# Patient Record
Sex: Male | Born: 1990 | Race: White | Hispanic: No | Marital: Married | Smoking: Former smoker
Health system: Southern US, Community
[De-identification: ages and names within clinical notes are randomized; demographics above are authoritative.]

## PROBLEM LIST (undated history)

## (undated) DIAGNOSIS — F909 Attention-deficit hyperactivity disorder, unspecified type: Secondary | ICD-10-CM

## (undated) DIAGNOSIS — L409 Psoriasis, unspecified: Secondary | ICD-10-CM

## (undated) HISTORY — DX: Psoriasis, unspecified: L40.9

---
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
## 2015-01-03 ENCOUNTER — Emergency Department (HOSPITAL_COMMUNITY)
Admission: EM | Admit: 2015-01-03 | Discharge: 2015-01-04 | Disposition: A | Discharge status: Home / Self Care | Payer: Commercial Managed Care - PPO | Attending: Emergency Medicine | Admitting: Emergency Medicine

## 2015-01-03 ENCOUNTER — Encounter (HOSPITAL_COMMUNITY): Payer: Self-pay | Admitting: *Deleted

## 2015-01-03 DIAGNOSIS — S299XXA Unspecified injury of thorax, initial encounter: Secondary | ICD-10-CM | POA: Insufficient documentation

## 2015-01-03 DIAGNOSIS — M546 Pain in thoracic spine: Secondary | ICD-10-CM

## 2015-01-03 DIAGNOSIS — S199XXA Unspecified injury of neck, initial encounter: Secondary | ICD-10-CM | POA: Insufficient documentation

## 2015-01-03 DIAGNOSIS — Z720 Tobacco use: Secondary | ICD-10-CM | POA: Insufficient documentation

## 2015-01-03 DIAGNOSIS — Y92410 Unspecified street and highway as the place of occurrence of the external cause: Secondary | ICD-10-CM | POA: Insufficient documentation

## 2015-01-03 DIAGNOSIS — Y9389 Activity, other specified: Secondary | ICD-10-CM | POA: Insufficient documentation

## 2015-01-03 DIAGNOSIS — M25571 Pain in right ankle and joints of right foot: Secondary | ICD-10-CM

## 2015-01-03 DIAGNOSIS — Y998 Other external cause status: Secondary | ICD-10-CM | POA: Insufficient documentation

## 2015-01-03 DIAGNOSIS — S99911A Unspecified injury of right ankle, initial encounter: Secondary | ICD-10-CM | POA: Diagnosis not present

## 2015-01-03 NOTE — ED Notes (Signed)
Pt reports back pain since the 8th of this month.  Pt also reports R ankle pain.  Denies any injury
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
## 2015-01-04 MED ORDER — NAPROXEN 500 MG PO TABS: 500.0000 mg | ORAL_TABLET | Freq: Two times a day (BID) | ORAL | Status: DC

## 2015-01-04 MED ORDER — METHOCARBAMOL 500 MG PO TABS: 500.0000 mg | ORAL_TABLET | Freq: Two times a day (BID) | ORAL | Status: DC

## 2015-01-04 NOTE — Discharge Instructions (Signed)
Muscle Strain °A muscle strain is an injury that occurs when a muscle is stretched beyond its normal length. Usually a small number of muscle fibers are torn when this happens. Muscle strain is rated in degrees. First-degree strains have the least amount of muscle fiber tearing and pain. Second-degree and third-degree strains have increasingly more tearing and pain.  °Usually, recovery from muscle strain takes 1-2 weeks. Complete healing takes 5-6 weeks.  °CAUSES  °Muscle strain happens when a sudden, violent force placed on a muscle stretches it too far. This may occur with lifting, sports, or a fall.  °RISK FACTORS °Muscle strain is especially common in athletes.  °SIGNS AND SYMPTOMS °At the site of the muscle strain, there may be: °· Pain. °· Bruising. °· Swelling. °· Difficulty using the muscle due to pain or lack of normal function. °DIAGNOSIS  °Your health care provider will perform a physical exam and ask about your medical history. °TREATMENT  °Often, the best treatment for a muscle strain is resting, icing, and applying cold compresses to the injured area.   °HOME CARE INSTRUCTIONS  °· Use the PRICE method of treatment to promote muscle healing during the first 2-3 days after your injury. The PRICE method involves: °¨ Protecting the muscle from being injured again. °¨ Restricting your activity and resting the injured body part. °¨ Icing your injury. To do this, put ice in a plastic bag. Place a towel between your skin and the bag. Then, apply the ice and leave it on from 15-20 minutes each hour. After the third day, switch to moist heat packs. °¨ Apply compression to the injured area with a splint or elastic bandage. Be careful not to wrap it too tightly. This may interfere with blood circulation or increase swelling. °¨ Elevate the injured body part above the level of your heart as often as you can. °· Only take over-the-counter or prescription medicines for pain, discomfort, or fever as directed by your  health care provider. °· Warming up prior to exercise helps to prevent future muscle strains. °SEEK MEDICAL CARE IF:  °· You have increasing pain or swelling in the injured area. °· You have numbness, tingling, or a significant loss of strength in the injured area. °MAKE SURE YOU:  °· Understand these instructions. °· Will watch your condition. °· Will get help right away if you are not doing well or get worse. °Document Released: 07/04/2005 Document Revised: 04/24/2013 Document Reviewed: 01/31/2013 °ExitCare® Patient Information ©2015 [employer]. This information is not intended to replace advice given to you by your health care provider. Make sure you discuss any questions you have with your health care provider. °RICE: Routine Care for Injuries °The routine care of many injuries includes Rest, Ice, Compression, and Elevation (RICE). °HOME CARE INSTRUCTIONS °· Rest is needed to allow your body to heal. Routine activities can usually be resumed when comfortable. Injured tendons and bones can take up to 6 weeks to heal. Tendons are the cord-like structures that attach muscle to bone. °· Ice following an injury helps keep the swelling down and reduces pain. °· Put ice in a plastic bag. °· Place a towel between your skin and the bag. °· Leave the ice on for 15-20 minutes, 3-4 times a day, or as directed by your health care provider. Do this while awake, for the first 24 to 48 hours. After that, continue as directed by your caregiver. °· Compression helps keep swelling down. It also gives support and helps with discomfort. If an   elastic bandage has been applied, it should be removed and reapplied every 3 to 4 hours. It should not be applied tightly, but firmly enough to keep swelling down. Watch fingers or toes for swelling, bluish discoloration, coldness, numbness, or excessive pain. If any of these problems occur, remove the bandage and reapply loosely. Contact your caregiver if these problems  continue. °· Elevation helps reduce swelling and decreases pain. With extremities, such as the arms, hands, legs, and feet, the injured area should be placed near or above the level of the heart, if possible. °SEEK IMMEDIATE MEDICAL CARE IF: °· You have persistent pain and swelling. °· You develop redness, numbness, or unexpected weakness. °· Your symptoms are getting worse rather than improving after several days. °These symptoms may indicate that further evaluation or further X-rays are needed. Sometimes, X-rays may not show a small broken bone (fracture) until 1 week or 10 days later. Make a follow-up appointment with your caregiver. Ask when your X-ray results will be ready. Make sure you get your X-ray results. °Document Released: 10/16/2000 Document Revised: 07/09/2013 Document Reviewed: 12/03/2010 °ExitCare® Patient Information ©2015 [employer]. This information is not intended to replace advice given to you by your health care provider. Make sure you discuss any questions you have with your health care provider. ° °

## 2015-02-13 ENCOUNTER — Emergency Department (HOSPITAL_COMMUNITY): Payer: Commercial Managed Care - PPO

## 2015-02-13 ENCOUNTER — Emergency Department (HOSPITAL_COMMUNITY)
Admission: EM | Admit: 2015-02-13 | Discharge: 2015-02-13 | Disposition: A | Discharge status: Home / Self Care | Payer: Commercial Managed Care - PPO | Attending: Emergency Medicine | Admitting: Emergency Medicine

## 2015-02-13 ENCOUNTER — Encounter (HOSPITAL_COMMUNITY): Payer: Self-pay

## 2015-02-13 DIAGNOSIS — Y998 Other external cause status: Secondary | ICD-10-CM | POA: Insufficient documentation

## 2015-02-13 DIAGNOSIS — Y9389 Activity, other specified: Secondary | ICD-10-CM | POA: Diagnosis not present

## 2015-02-13 DIAGNOSIS — W501XXA Accidental kick by another person, initial encounter: Secondary | ICD-10-CM | POA: Insufficient documentation

## 2015-02-13 DIAGNOSIS — S3991XA Unspecified injury of abdomen, initial encounter: Secondary | ICD-10-CM | POA: Diagnosis present

## 2015-02-13 DIAGNOSIS — Y9289 Other specified places as the place of occurrence of the external cause: Secondary | ICD-10-CM | POA: Insufficient documentation

## 2015-02-13 DIAGNOSIS — S301XXA Contusion of abdominal wall, initial encounter: Secondary | ICD-10-CM | POA: Insufficient documentation

## 2015-02-13 DIAGNOSIS — F419 Anxiety disorder, unspecified: Secondary | ICD-10-CM | POA: Insufficient documentation

## 2015-02-13 DIAGNOSIS — Z720 Tobacco use: Secondary | ICD-10-CM | POA: Diagnosis not present

## 2015-02-13 LAB — CBC WITH DIFFERENTIAL/PLATELET
Basophils Absolute: 0 10*3/uL (ref 0.0–0.1)
Basophils Relative: 0 % (ref 0–1)
Eosinophils Absolute: 0.2 10*3/uL (ref 0.0–0.7)
Eosinophils Relative: 2 % (ref 0–5)
HCT: 39.9 % (ref 39.0–52.0)
Hemoglobin: 13.3 g/dL (ref 13.0–17.0)
Lymphocytes Relative: 33 % (ref 12–46)
Lymphs Abs: 3.1 10*3/uL (ref 0.7–4.0)
MCH: 28.1 pg (ref 26.0–34.0)
MCHC: 33.3 g/dL (ref 30.0–36.0)
MCV: 84.2 fL (ref 78.0–100.0)
Monocytes Absolute: 1.3 10*3/uL — ABNORMAL HIGH (ref 0.1–1.0)
Monocytes Relative: 13 % — ABNORMAL HIGH (ref 3–12)
Neutro Abs: 4.8 10*3/uL (ref 1.7–7.7)
Neutrophils Relative %: 52 % (ref 43–77)
Platelets: 272 10*3/uL (ref 150–400)
RBC: 4.74 MIL/uL (ref 4.22–5.81)
RDW: 13.1 % (ref 11.5–15.5)
WBC: 9.3 10*3/uL (ref 4.0–10.5)

## 2015-02-13 LAB — URINALYSIS, ROUTINE W REFLEX MICROSCOPIC
Bilirubin Urine: NEGATIVE
Glucose, UA: NEGATIVE mg/dL
Hgb urine dipstick: NEGATIVE
Ketones, ur: NEGATIVE mg/dL
Leukocytes, UA: NEGATIVE
Nitrite: NEGATIVE
Protein, ur: NEGATIVE mg/dL
Specific Gravity, Urine: 1.034 — ABNORMAL HIGH (ref 1.005–1.030)
Urobilinogen, UA: 0.2 mg/dL (ref 0.0–1.0)
pH: 7 (ref 5.0–8.0)

## 2015-02-13 LAB — COMPREHENSIVE METABOLIC PANEL
ALT: 38 U/L (ref 17–63)
AST: 30 U/L (ref 15–41)
Albumin: 4 g/dL (ref 3.5–5.0)
Alkaline Phosphatase: 41 U/L (ref 38–126)
Anion gap: 6 (ref 5–15)
BUN: 10 mg/dL (ref 6–20)
CO2: 29 mmol/L (ref 22–32)
Calcium: 9 mg/dL (ref 8.9–10.3)
Chloride: 104 mmol/L (ref 101–111)
Creatinine, Ser: 0.82 mg/dL (ref 0.61–1.24)
GFR calc Af Amer: >60 mL/min (ref >60)
GFR calc non Af Amer: >60 mL/min (ref >60)
Glucose, Bld: 110 mg/dL — ABNORMAL HIGH (ref 65–99)
Potassium: 3.3 mmol/L — ABNORMAL LOW (ref 3.5–5.1)
Sodium: 139 mmol/L (ref 135–145)
Total Bilirubin: 0.6 mg/dL (ref 0.3–1.2)
Total Protein: 7.3 g/dL (ref 6.5–8.1)

## 2015-02-13 LAB — SAMPLE TO BLOOD BANK

## 2015-02-13 MED ORDER — CYCLOBENZAPRINE HCL 5 MG PO TABS: 5.0000 mg | ORAL_TABLET | Freq: Three times a day (TID) | ORAL | Status: DC | PRN

## 2015-02-13 MED ORDER — NAPROXEN 500 MG PO TABS: ORAL_TABLET | ORAL | Status: DC

## 2015-02-13 MED ORDER — SODIUM CHLORIDE 0.9 % IV BOLUS (SEPSIS)
1000.0000 mL | Freq: Once | INTRAVENOUS | Status: AC
  Administered 2015-02-13: 1000 mL via INTRAVENOUS

## 2015-02-13 MED ORDER — SODIUM CHLORIDE 0.9 % IV SOLN
INTRAVENOUS | Status: DC
  Administered 2015-02-13: 04:00:00 via INTRAVENOUS

## 2015-02-13 MED ORDER — FENTANYL CITRATE (PF) 100 MCG/2ML IJ SOLN: 50.0000 ug | Freq: Once | INTRAMUSCULAR | Status: DC

## 2015-02-13 MED ORDER — TRAMADOL HCL 50 MG PO TABS: ORAL_TABLET | ORAL | Status: DC

## 2015-02-13 MED ORDER — IOHEXOL 300 MG/ML  SOLN
100.0000 mL | Freq: Once | INTRAMUSCULAR | Status: AC | PRN
  Administered 2015-02-13: 100 mL via INTRAVENOUS

## 2015-02-13 MED ORDER — ONDANSETRON HCL 4 MG/2ML IJ SOLN
4.0000 mg | Freq: Once | INTRAMUSCULAR | Status: AC
  Administered 2015-02-13: 4 mg via INTRAVENOUS
  Filled 2015-02-13: qty 2

## 2015-02-13 MED ORDER — FENTANYL CITRATE (PF) 100 MCG/2ML IJ SOLN
25.0000 ug | Freq: Once | INTRAMUSCULAR | Status: AC
  Administered 2015-02-13: 25 ug via INTRAVENOUS
  Filled 2015-02-13: qty 2

## 2015-02-13 NOTE — ED Notes (Signed)
MD I. Knapp at bedside. 

## 2015-02-13 NOTE — ED Notes (Signed)
Pt aware of the need for a urine sample. 
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
## 2015-02-13 NOTE — ED Notes (Addendum)
Pt alert,oriented, and ambulatory upon DC. He was advised to follow up with PCP and come back if he worsens and to take medications as prescribed to help relieve pain. He verabalizes understanding. He leaves with two male friends.
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
## 2015-02-13 NOTE — ED Notes (Signed)
MD Knapp at bedside 

## 2015-02-13 NOTE — Discharge Instructions (Signed)
Ice packs to the bruised area for pain and use heat to help the blood reabsorb. Take the medication as prescribed for pain.  Return to the ED if you get a fever, worsening pain, vomiting, blood in your BM's or urine.    Contusion A contusion is a deep bruise. Contusions are the result of an injury that caused bleeding under the skin. The contusion may turn blue, purple, or yellow. Minor injuries will give you a painless contusion, but more severe contusions may stay painful and swollen for a few weeks.  CAUSES  A contusion is usually caused by a blow, trauma, or direct force to an area of the body. SYMPTOMS   Swelling and redness of the injured area.  Bruising of the injured area.  Tenderness and soreness of the injured area.  Pain. DIAGNOSIS  The diagnosis can be made by taking a history and physical exam. An X-ray, CT scan, or MRI may be needed to determine if there were any associated injuries, such as fractures. TREATMENT  Specific treatment will depend on what area of the body was injured. In general, the best treatment for a contusion is resting, icing, elevating, and applying cold compresses to the injured area. Over-the-counter medicines may also be recommended for pain control. Ask your caregiver what the best treatment is for your contusion. HOME CARE INSTRUCTIONS   Put ice on the injured area.  Put ice in a plastic bag.  Place a towel between your skin and the bag.  Leave the ice on for 15-20 minutes, 3-4 times a day, or as directed by your health care provider.  Only take over-the-counter or prescription medicines for pain, discomfort, or fever as directed by your caregiver. Your caregiver may recommend avoiding anti-inflammatory medicines (aspirin, ibuprofen, and naproxen) for 48 hours because these medicines may increase bruising.  Rest the injured area.  If possible, elevate the injured area to reduce swelling. SEEK IMMEDIATE MEDICAL CARE IF:   You have increased  bruising or swelling.  You have pain that is getting worse.  Your swelling or pain is not relieved with medicines. MAKE SURE YOU:   Understand these instructions.  Will watch your condition.  Will get help right away if you are not doing well or get worse. Document Released: 04/13/2005 Document Revised: 07/09/2013 Document Reviewed: 05/09/2011 [facility] Patient Information 2015 [place], [place]. This information is not intended to replace advice given to you by your health care provider. Make sure you discuss any questions you have with your health care provider.

## 2015-09-20 ENCOUNTER — Emergency Department (INDEPENDENT_AMBULATORY_CARE_PROVIDER_SITE_OTHER): Payer: Self-pay

## 2015-09-20 ENCOUNTER — Emergency Department (INDEPENDENT_AMBULATORY_CARE_PROVIDER_SITE_OTHER)
Admission: EM | Admit: 2015-09-20 | Discharge: 2015-09-20 | Disposition: A | Discharge status: Home / Self Care | Payer: Self-pay | Source: Home / Self Care | Attending: Family Medicine | Admitting: Family Medicine

## 2015-09-20 ENCOUNTER — Encounter (HOSPITAL_COMMUNITY): Payer: Self-pay | Admitting: Emergency Medicine

## 2015-09-20 DIAGNOSIS — M545 Low back pain, unspecified: Secondary | ICD-10-CM

## 2015-09-20 MED ORDER — CYCLOBENZAPRINE HCL 10 MG PO TABS: 10.0000 mg | ORAL_TABLET | Freq: Two times a day (BID) | ORAL | Status: DC | PRN

## 2015-09-20 MED ORDER — NAPROXEN SODIUM 550 MG PO TABS: 550.0000 mg | ORAL_TABLET | Freq: Two times a day (BID) | ORAL | Status: DC

## 2015-09-20 NOTE — Discharge Instructions (Signed)
Back Pain, Adult Back pain is very common in adults.The cause of back pain is rarely dangerous and the pain often gets better over time.The cause of your back pain may not be known. Some common causes of back pain include:  Strain of the muscles or ligaments supporting the spine.  Wear and tear (degeneration) of the spinal disks.  Arthritis.  Direct injury to the back. For many people, back pain may return. Since back pain is rarely dangerous, most people can learn to manage this condition on their own. HOME CARE INSTRUCTIONS Watch your back pain for any changes. The following actions may help to lessen any discomfort you are feeling:  Remain active. It is stressful on your back to sit or stand in one place for long periods of time. Do not sit, drive, or stand in one place for more than 30 minutes at a time. Take short walks on even surfaces as soon as you are able.Try to increase the length of time you walk each day.  Exercise regularly as directed by your health care provider. Exercise helps your back heal faster. It also helps avoid future injury by keeping your muscles strong and flexible.  Do not stay in bed.Resting more than 1-2 days can delay your recovery.  Pay attention to your body when you bend and lift. The most comfortable positions are those that put less stress on your recovering back. Always use proper lifting techniques, including:  Bending your knees.  Keeping the load close to your body.  Avoiding twisting.  Find a comfortable position to sleep. Use a firm mattress and lie on your side with your knees slightly bent. If you lie on your back, put a pillow under your knees.  Avoid feeling anxious or stressed.Stress increases muscle tension and can worsen back pain.It is important to recognize when you are anxious or stressed and learn ways to manage it, such as with exercise.  Take medicines only as directed by your health care provider. Over-the-counter  medicines to reduce pain and inflammation are often the most helpful.Your health care provider may prescribe muscle relaxant drugs.These medicines help dull your pain so you can more quickly return to your normal activities and healthy exercise.  Apply ice to the injured area:  Put ice in a plastic bag.  Place a towel between your skin and the bag.  Leave the ice on for 20 minutes, 2-3 times a day for the first 2-3 days. After that, ice and heat may be alternated to reduce pain and spasms.  Maintain a healthy weight. Excess weight puts extra stress on your back and makes it difficult to maintain good posture. SEEK MEDICAL CARE IF:  You have pain that is not relieved with rest or medicine.  You have increasing pain going down into the legs or buttocks.  You have pain that does not improve in one week.  You have night pain.  You lose weight.  You have a fever or chills. SEEK IMMEDIATE MEDICAL CARE IF:   You develop new bowel or bladder control problems.  You have unusual weakness or numbness in your arms or legs.  You develop nausea or vomiting.  You develop abdominal pain.  You feel faint.   This information is not intended to replace advice given to you by your health care provider. Make sure you discuss any questions you have with your health care provider.   Document Released: 07/04/2005 Document Revised: 07/25/2014 Document Reviewed: 11/05/2013 Elsevier Interactive Patient Education 2016 [employer] ° °Back Injury Prevention °Back injuries can be very painful. They can also be difficult to heal. After having one back injury, you are more likely to injure your back again. It is important to learn how to avoid injuring or re-injuring your back. The following tips can help you to prevent a back injury. °WHAT SHOULD I KNOW ABOUT PHYSICAL FITNESS? °· Exercise for 30 minutes per day on most days of the week or as directed by your health care provider. Make sure to: °¨ Do  aerobic exercises, such as walking, jogging, biking, or swimming. °¨ Do exercises that increase balance and strength, such as tai chi and yoga. These can decrease your risk of falling and injuring your back. °¨ Do stretching exercises to help with flexibility. °¨ Try to develop strong abdominal muscles. Your abdominal muscles provide a lot of the support that is needed by your back. °· Maintain a healthy weight.  This helps to decrease your risk of a back injury. °WHAT SHOULD I KNOW ABOUT MY DIET? °· Talk with your health care provider about your overall diet. Take supplements and vitamins only as directed by your health care provider. °· Talk with your health care provider about how much calcium and vitamin D you need each day. These nutrients help to prevent weakening of the bones (osteoporosis). Osteoporosis can cause broken (fractured) bones, which lead to back pain. °· Include good sources of calcium in your diet, such as dairy products, green leafy vegetables, and products that have had calcium added to them (fortified). °· Include good sources of vitamin D in your diet, such as milk and foods that are fortified with vitamin D. °WHAT SHOULD I KNOW ABOUT MY POSTURE? °· Sit up straight and stand up straight. Avoid leaning forward when you sit or hunching over when you stand. °· Choose chairs that have good low-back (lumbar) support. °· If you work at a desk, sit close to it so you do not need to lean over. Keep your chin tucked in. Keep your neck drawn back, and keep your elbows bent at a right angle. Your arms should look like the letter "L." °· Sit high and close to the steering wheel when you drive. Add a lumbar support to your car seat, if needed. °· Avoid sitting or standing in one position for very long. Take breaks to get up, stretch, and walk around at least one time every hour. Take breaks every hour if you are driving for long periods of time. °· Sleep on your side with your knees slightly bent, or  sleep on your back with a pillow under your knees. Do not lie on the front of your body to sleep. °WHAT SHOULD I KNOW ABOUT LIFTING, TWISTING, AND REACHING? °Lifting and Heavy Lifting °· Avoid heavy lifting, especially repetitive heavy lifting. If you must do heavy lifting: °¨ Stretch before lifting. °¨ Work slowly. °¨ Rest between lifts. °¨ Use a tool such as a cart or a dolly to move objects if one is available. °¨ Make several small trips instead of carrying one heavy load. °¨ Ask for help when you need it, especially when moving big objects. °· Follow these steps when lifting: °¨ Stand with your feet shoulder-width apart. °¨ Get as close to the object as you can. Do not try to pick up a heavy object that is far from your body. °¨ Use handles or lifting straps if they are available. °¨ Bend at your knees. Squat down, but keep your heels   off the floor. °¨ Keep your shoulders pulled back, your chin tucked in, and your back straight. °¨ Lift the object slowly while you tighten the muscles in your legs, abdomen, and buttocks. Keep the object as close to the center of your body as possible. °· Follow these steps when putting down a heavy load: °¨ Stand with your feet shoulder-width apart. °¨ Lower the object slowly while you tighten the muscles in your legs, abdomen, and buttocks. Keep the object as close to the center of your body as possible. °¨ Keep your shoulders pulled back, your chin tucked in, and your back straight. °¨ Bend at your knees. Squat down, but keep your heels off the floor. °¨ Use handles or lifting straps if they are available. °Twisting and Reaching °· Avoid lifting heavy objects above your waist. °· Do not twist at your waist while you are lifting or carrying a load. If you need to turn, move your feet. °· Do not bend over without bending at your knees. °· Avoid reaching over your head, across a table, or for an object on a high surface. °WHAT ARE SOME OTHER TIPS? °· Avoid wet floors and icy  ground. Keep sidewalks clear of ice to prevent falls. °· Do not sleep on a mattress that is too soft or too hard. °· Keep items that are used frequently within easy reach. °· Put heavier objects on shelves at waist level, and put lighter objects on lower or higher shelves. °· Find ways to decrease your stress, such as exercise, massage, or relaxation techniques. Stress can build up in your muscles. Tense muscles are more vulnerable to injury. °· Talk with your health care provider if you feel anxious or depressed. These conditions can make back pain worse. °· Wear flat heel shoes with cushioned soles. °· Avoid sudden movements. °· Use both shoulder straps when carrying a backpack. °· Do not use any tobacco products, including cigarettes, chewing tobacco, or electronic cigarettes. If you need help quitting, ask your health care provider. °  °This information is not intended to replace advice given to you by your health care provider. Make sure you discuss any questions you have with your health care provider. °  °Document Released: 08/11/2004 Document Revised: 11/18/2014 Document Reviewed: 07/08/2014 °Elsevier Interactive Patient Education ©2016 Elsevier Inc. ° °
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
## 2016-06-03 ENCOUNTER — Ambulatory Visit (HOSPITAL_COMMUNITY)
Admission: EM | Admit: 2016-06-03 | Discharge: 2016-06-03 | Disposition: A | Discharge status: Home / Self Care | Payer: No Typology Code available for payment source | Attending: Family Medicine | Admitting: Family Medicine

## 2016-06-03 ENCOUNTER — Ambulatory Visit (INDEPENDENT_AMBULATORY_CARE_PROVIDER_SITE_OTHER): Payer: No Typology Code available for payment source

## 2016-06-03 ENCOUNTER — Encounter (HOSPITAL_COMMUNITY): Payer: Self-pay | Admitting: Family Medicine

## 2016-06-03 DIAGNOSIS — Y92099 Unspecified place in other non-institutional residence as the place of occurrence of the external cause: Secondary | ICD-10-CM | POA: Diagnosis not present

## 2016-06-03 DIAGNOSIS — Y92009 Unspecified place in unspecified non-institutional (private) residence as the place of occurrence of the external cause: Principal | ICD-10-CM

## 2016-06-03 DIAGNOSIS — S8011XA Contusion of right lower leg, initial encounter: Secondary | ICD-10-CM

## 2016-06-03 DIAGNOSIS — W19XXXA Unspecified fall, initial encounter: Secondary | ICD-10-CM

## 2016-06-03 MED ORDER — TRAMADOL HCL 50 MG PO TABS: 50.0000 mg | ORAL_TABLET | Freq: Four times a day (QID) | ORAL | 0 refills | Status: DC | PRN

## 2016-06-03 NOTE — ED Provider Notes (Signed)
[facility]    CSN: [id number] Arrival date & time: 06/03/16  1829     History   Chief Complaint Chief Complaint  Patient presents with  . Fall    HPI Jonathan Valdez is a 25 y.o. male.   The history is provided by the patient.  Fall  This is a new problem. The current episode started yesterday. The problem has not changed since onset.Pertinent negatives include no headaches. Associated symptoms comments: Right elbow, knee and ankle injury from getting knocked down while coming down stairs at home.. The symptoms are aggravated by bending.    History reviewed. No pertinent past medical history.  There are no active problems to display for this patient.   History reviewed. No pertinent surgical history.     Home Medications    Prior to Admission medications   Not on File    Family History History reviewed. No pertinent family history.  Social History Social History  Substance Use Topics  . Smoking status: Current Every Day Smoker  . Smokeless tobacco: Never Used  . Alcohol use No     Allergies   Patient has no known allergies.   Review of Systems Review of Systems  Constitutional: Negative.   Genitourinary: Negative.   Musculoskeletal: Positive for gait problem and joint swelling.  Skin: Negative.   Neurological: Negative for seizures, speech difficulty, numbness and headaches.  All other systems reviewed and are negative.    Physical Exam Triage Vital Signs ED Triage Vitals  Enc Vitals Group     BP 06/03/16 1849 122/64     Pulse Rate 06/03/16 1849 74     Resp 06/03/16 1849 18     Temp 06/03/16 1849 97.9 F (36.6 C)     Temp Source 06/03/16 1849 Oral     SpO2 06/03/16 1849 98 %     Weight --      Height --      Head Circumference --      Peak Flow --      Pain Score 06/03/16 1850 7     Pain Loc --      Pain Edu? --      Excl. in GC? --    No data found.   Updated Vital Signs BP 122/64   Pulse 74   Temp 97.9 F (36.6  C) (Oral)   Resp 18   SpO2 98%   Visual Acuity Right Eye Distance:   Left Eye Distance:   Bilateral Distance:    Right Eye Near:   Left Eye Near:    Bilateral Near:     Physical Exam  Constitutional: He is oriented to person, place, and time. He appears well-developed and well-nourished. No distress.  Musculoskeletal: He exhibits tenderness. He exhibits no deformity.       Right elbow: He exhibits swelling. He exhibits normal range of motion, no effusion and no deformity. Tenderness found. Olecranon process tenderness noted.       Right knee: He exhibits ecchymosis, erythema and bony tenderness. He exhibits normal range of motion, no swelling, no effusion, normal alignment, no LCL laxity, normal patellar mobility, normal meniscus and no MCL laxity. Tenderness found. Lateral joint line tenderness noted. No MCL, no LCL and no patellar tendon tenderness noted.       Right ankle: He exhibits swelling. He exhibits normal pulse. Tenderness. Lateral malleolus tenderness found. Achilles tendon normal. Achilles tendon exhibits no pain, no defect and normal Thompson's test results.  Neurological: He is  alert and oriented to person, place, and time.  Skin: Skin is warm.  Nursing note and vitals reviewed.    UC Treatments / Results  Labs (all labs ordered are listed, but only abnormal results are displayed) Labs Reviewed - No data to display  EKG  EKG Interpretation None       Radiology No results found. X-rays reviewed and report per radiologist.  Procedures Procedures (including critical care time)  Medications Ordered in UC Medications - No data to display   Initial Impression / Assessment and Plan / UC Course  I have reviewed the triage vital signs and the nursing notes.  Pertinent labs & imaging results that were available during my care of the patient were reviewed by me and considered in my medical decision making (see chart for details).  Clinical Course        Final Clinical Impressions(s) / UC Diagnoses   Final diagnoses:  None    New Prescriptions New Prescriptions   No medications on file     Linna HoffJames D Jacquan Savas, MD 06/03/16 1930

## 2016-06-03 NOTE — ED Triage Notes (Signed)
Pt here for fall yesterday. sts he was tripped by his neighbors dog and fell injuring right elbow, knee, and ankle.

## 2016-06-03 NOTE — Discharge Instructions (Signed)
Ice advil or pain medicine as needed, out of work until mon.11/20.

## 2016-10-04 ENCOUNTER — Ambulatory Visit (HOSPITAL_COMMUNITY)
Admission: EM | Admit: 2016-10-04 | Discharge: 2016-10-04 | Disposition: A | Discharge status: Home / Self Care | Payer: Managed Care, Other (non HMO) | Attending: Family Medicine | Admitting: Family Medicine

## 2016-10-04 ENCOUNTER — Encounter (HOSPITAL_COMMUNITY): Payer: Self-pay | Admitting: Family Medicine

## 2016-10-04 DIAGNOSIS — S63502A Unspecified sprain of left wrist, initial encounter: Secondary | ICD-10-CM

## 2016-10-04 DIAGNOSIS — M654 Radial styloid tenosynovitis [de Quervain]: Secondary | ICD-10-CM | POA: Diagnosis not present

## 2016-10-04 MED ORDER — IBUPROFEN 800 MG PO TABS: 800.0000 mg | ORAL_TABLET | Freq: Three times a day (TID) | ORAL | 0 refills | Status: DC

## 2016-10-04 NOTE — ED Provider Notes (Signed)
CSN: [id number]     Arrival date & time 10/04/16  1850 History   None    Chief Complaint  Patient presents with  . Wrist Pain   (Consider location/radiation/quality/duration/timing/severity/associated sxs/prior Treatment) Patient c/o left wrist pain.  Patient has pain in wrist radiating to left thumb.   The history is provided by the patient.  Wrist Pain  This is a new problem. The current episode started less than 1 hour ago. The problem occurs constantly. The problem has not changed since onset.Nothing aggravates the symptoms. Nothing relieves the symptoms.    History reviewed. No pertinent past medical history. History reviewed. No pertinent surgical history. History reviewed. No pertinent family history. Social History  Substance Use Topics  . Smoking status: Current Every Day Smoker  . Smokeless tobacco: Never Used  . Alcohol use No    Review of Systems  Constitutional: Negative.   HENT: Negative.   Eyes: Negative.   Respiratory: Negative.   Cardiovascular: Negative.   Gastrointestinal: Negative.   Endocrine: Negative.   Genitourinary: Negative.   Musculoskeletal: Positive for arthralgias.  Allergic/Immunologic: Negative.   Neurological: Negative.     Allergies  Patient has no known allergies.  Home Medications   Prior to Admission medications   Medication Sig Start Date End Date Taking? Authorizing Provider  ibuprofen (ADVIL,MOTRIN) 800 MG tablet Take 1 tablet (800 mg total) by mouth 3 (three) times daily. 10/04/16   Deatra CanterWilliam J Tyvion Edmondson, FNP   Meds Ordered and Administered this Visit  Medications - No data to display  BP 129/86   Pulse 64   Temp 98.4 F (36.9 C)   Resp 16   SpO2 98%  No data found.   Physical Exam  Constitutional: He is oriented to person, place, and time. He appears well-developed and well-nourished.  HENT:  Head: Normocephalic and atraumatic.  Eyes: Conjunctivae and EOM are normal. Pupils are equal, round, and reactive to light.   Neck: Normal range of motion. Neck supple.  Cardiovascular: Normal rate, regular rhythm and normal heart sounds.   Pulmonary/Chest: Effort normal and breath sounds normal.  Musculoskeletal: He exhibits tenderness.  Positive left finklestein   Neurological: He is alert and oriented to person, place, and time.  Nursing note and vitals reviewed.   Urgent Care Course     Procedures (including critical care time)  Labs Review Labs Reviewed - No data to display  Imaging Review No results found.   Visual Acuity Review  Right Eye Distance:   Left Eye Distance:   Bilateral Distance:    Right Eye Near:   Left Eye Near:    Bilateral Near:         MDM   1. Tenosynovitis, de Quervain   2. Sprain of left wrist, initial encounter    Motrin 800mg  one po tid x 10 days #30 Left Spica Splint      Deatra CanterWilliam J Katee Wentland, FNP 10/04/16 1932    Deatra CanterWilliam J Ethelle Ola, FNP 10/04/16 1944

## 2016-10-04 NOTE — ED Triage Notes (Signed)
pt here for left wrist pain. sts that he has been hearing loud popping sounds and has limited ROM. sts unsure if injury but started about a month ago and worse.

## 2017-02-15 ENCOUNTER — Encounter: Payer: Self-pay | Admitting: Emergency Medicine

## 2017-02-15 ENCOUNTER — Emergency Department
Admission: EM | Admit: 2017-02-15 | Discharge: 2017-02-15 | Disposition: A | Discharge status: Home / Self Care | Payer: Managed Care, Other (non HMO) | Attending: Emergency Medicine | Admitting: Emergency Medicine

## 2017-02-15 DIAGNOSIS — F17210 Nicotine dependence, cigarettes, uncomplicated: Secondary | ICD-10-CM | POA: Insufficient documentation

## 2017-02-15 DIAGNOSIS — M2550 Pain in unspecified joint: Secondary | ICD-10-CM | POA: Diagnosis not present

## 2017-02-15 LAB — GLUCOSE, CAPILLARY: Glucose-Capillary: 113 mg/dL — ABNORMAL HIGH (ref 65–99)

## 2017-02-15 MED ORDER — MELOXICAM 15 MG PO TABS: 15.0000 mg | ORAL_TABLET | Freq: Every day | ORAL | 0 refills | Status: DC

## 2017-02-15 NOTE — Discharge Instructions (Signed)
Please follow up with Primary Care when possible. Return to the ER for symptoms that change or worsen if unable to schedule an appointment.

## 2017-02-15 NOTE — ED Provider Notes (Signed)
[facility] Emergency Department Provider Note ____________________________________________  Time seen: Approximately 6:27 PM  I have reviewed the triage vital signs and the nursing notes.   HISTORY  Chief Complaint Joint Pain    HPI Jonathan Valdez is a 26 y.o. male who presents to the emergency department for evaluation of joint pain that has been present for the past year or so.Pain is mainly in both wrists and causes him to lose grip strength after he spent at work several hours. He states that he also has pain in the joints of his knees, ankles, and his back but denies any radicular symptoms in those areas. Upon awakening, he states that he is barely able to get out of bed but after some time and moving around the stiffness and pain improves. He reports a family history of rheumatoid arthritis. He also reports a family history of diabetes. He has not been tested for any illness or had a physical since he was a child. He has taken ibuprofen intermittently without any relief. He denies recent injury. He does a lot of repetitive motion and lifting while at work and feels that this has contributed to his symptoms. He states that his work required that he come for an evaluation and a work excuse if he needed to miss any additional days of work. He states that he did not work yesterday due to the pain in the wrists.  History reviewed. No pertinent past medical history.  There are no active problems to display for this patient.   No past surgical history on file.  Prior to Admission medications   Medication Sig Start Date End Date Taking? Authorizing Provider  meloxicam (MOBIC) 15 MG tablet Take 1 tablet (15 mg total) by mouth daily. 02/15/17   Chinita Pesterriplett, Charity Tessier B, FNP    Allergies Patient has no known allergies.  No family history on file.  Social History Social History  Substance Use Topics  . Smoking status: Current Every Day Smoker  . Smokeless tobacco: Never  Used  . Alcohol use No    Review of Systems Constitutional: Negative for recent illness or injury. Cardiovascular: Negative for change in skin temperature or color. Respiratory: Negative for cough or shortness of breath. Musculoskeletal: Positive for polyarthralgias. Skin: Negative for rash, lesion, or wound.  Neurological: Positive for radicular symptoms in the wrists  ____________________________________________   PHYSICAL EXAM:  VITAL SIGNS: ED Triage Vitals [02/15/17 1807]  Enc Vitals Group     BP (!) 143/69     Pulse Rate 98     Resp 18     Temp 98.2 F (36.8 C)     Temp Source Oral     SpO2 96 %     Weight 280 lb (127 kg)     Height 6\' 6"  (1.981 m)     Head Circumference      Peak Flow      Pain Score 5     Pain Loc      Pain Edu?      Excl. in GC?     Constitutional: Alert and oriented. Well appearing and in no acute distress. Eyes: Conjunctivae are clear without discharge or drainage.  Head: Atraumatic Neck: Full, active range of motion is observed. Respiratory: Respirations are even and unlabored Musculoskeletal: Full, active range of motion throughout. Neurologic: Positive Tinel's and Phalen bilaterally  Skin: Atraumatic without rash, lesion, or wound. Psychiatric: Behavior and affect are appropriate.  ____________________________________________   LABS (all labs ordered are  listed, but only abnormal results are displayed)  Labs Reviewed  GLUCOSE, CAPILLARY - Abnormal; Notable for the following:       Result Value   Glucose-Capillary 113 (*)    All other components within normal limits  CBG MONITORING, ED   ____________________________________________  RADIOLOGY  Not indicated ____________________________________________   PROCEDURES  Procedure(s) performed: None  ____________________________________________   INITIAL IMPRESSION / ASSESSMENT AND PLAN / ED COURSE  Jonathan Valdez is a 26 y.o. male who presents to the emergency  department for evaluation of widespread joint pain that has progressively worsened over the past year. No sudden changes that prompted his visit to the emergency department other than having had to call out of work last night. He has taken ibuprofen intermittently without any relief. He was concerned that he may be diabetic and CBG was 113 which provided him reassurance. He will be given a prescription for meloxicam and encouraged to establish a primary care provider that can do a full physical including specific labs to diagnose or rule out rheumatoid arthritis. He was advised to return to the emergency department for symptoms that change or worsen or for new concerns if he is unable schedule an appointment.  Pertinent labs & imaging results that were available during my care of the patient were reviewed by me and considered in my medical decision making (see chart for details).  _________________________________________   FINAL CLINICAL IMPRESSION(S) / ED DIAGNOSES  Final diagnoses:  Polyarthralgia    Discharge Medication List as of 02/15/2017  7:20 PM    START taking these medications   Details  meloxicam (MOBIC) 15 MG tablet Take 1 tablet (15 mg total) by mouth daily., Starting Wed 02/15/2017, Print        If controlled substance prescribed during this visit, 12 month history viewed on the [facility] prior to issuing an initial prescription for Schedule II or III opiod.    Chinita Pesterriplett, Genecis Veley B, FNP 02/15/17 2145    Phineas SemenGoodman, [place], MD 02/15/17 2242

## 2017-02-15 NOTE — ED Triage Notes (Signed)
Pt reports ankle, knee and elbow aches for over one year. Pt reports wt loss has helped some. Pt also reports pins and needles feeling in wrists for 6 months. Pt reports does a lot of repetitive motions at work.

## 2017-02-15 NOTE — ED Notes (Signed)
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

## 2017-04-29 ENCOUNTER — Ambulatory Visit (HOSPITAL_COMMUNITY)
Admission: EM | Admit: 2017-04-29 | Discharge: 2017-04-29 | Disposition: A | Discharge status: Home / Self Care | Payer: Managed Care, Other (non HMO) | Attending: Internal Medicine | Admitting: Internal Medicine

## 2017-04-29 ENCOUNTER — Encounter (HOSPITAL_COMMUNITY): Payer: Self-pay | Admitting: Emergency Medicine

## 2017-04-29 DIAGNOSIS — R079 Chest pain, unspecified: Secondary | ICD-10-CM

## 2017-04-29 DIAGNOSIS — R0789 Other chest pain: Secondary | ICD-10-CM

## 2017-04-29 DIAGNOSIS — R0981 Nasal congestion: Secondary | ICD-10-CM | POA: Diagnosis not present

## 2017-04-29 DIAGNOSIS — J209 Acute bronchitis, unspecified: Secondary | ICD-10-CM | POA: Diagnosis not present

## 2017-04-29 MED ORDER — OMEPRAZOLE 40 MG PO CPDR: 40.0000 mg | DELAYED_RELEASE_CAPSULE | Freq: Two times a day (BID) | ORAL | 1 refills | Status: DC

## 2017-04-29 MED ORDER — TRIAMCINOLONE ACETONIDE 55 MCG/ACT NA AERO: 2.0000 | INHALATION_SPRAY | Freq: Every day | NASAL | 0 refills | Status: DC

## 2017-04-29 MED ORDER — ALBUTEROL SULFATE HFA 108 (90 BASE) MCG/ACT IN AERS: 1.0000 | INHALATION_SPRAY | RESPIRATORY_TRACT | 0 refills | Status: DC | PRN

## 2017-04-29 MED ORDER — IPRATROPIUM-ALBUTEROL 0.5-2.5 (3) MG/3ML IN SOLN
3.0000 mL | Freq: Once | RESPIRATORY_TRACT | Status: AC
  Administered 2017-04-29: 3 mL via RESPIRATORY_TRACT

## 2017-04-29 MED ORDER — IPRATROPIUM-ALBUTEROL 0.5-2.5 (3) MG/3ML IN SOLN
RESPIRATORY_TRACT | Status: AC
  Filled 2017-04-29: qty 3

## 2017-04-29 NOTE — ED Triage Notes (Signed)
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 NAD.Marland Kitchen. ambulatory

## 2017-04-29 NOTE — Discharge Instructions (Addendum)
No danger signs on exam today. EKG did not show a heart attack today. Go to the emergency room if severe/sustained chest pain occurs. Breathing treatment was given in the urgent care today. Prescription for an albuterol inhaler and a nasal steroid inhaler were sent to the pharmacy, to help with symptoms of respiratory infection. Prescription for omeprazole, to help with stomach acid and hopefully with chest discomfort, was sent to the pharmacy. Continue efforts to establish with a primary care provider, to continue evaluation of symptoms. Note for work today and tomorrow.  Recheck for new fever >100.5, increasing phlegm production/nasal discharge, or if not starting to improve in a few days.

## 2017-04-29 NOTE — ED Provider Notes (Signed)
[facility]    CSN: [id number] Arrival date & time: 04/29/17  1504     History   Chief Complaint Chief Complaint  Patient presents with  . Chest Pain    HPI Jonathan Valdez is a 26 y.o. male. He presents today with chest pain that started this morning. He is under a lot of stress, a lot of it is work stress, and he is looking for another job. He has chest pain on and off, and has other symptoms that he feels are stress related.  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 Additional symptoms include a feeling of being off balance, some nausea, some diarrhea. Tingling in his hands. He does take in some energy drinks, denies the use of cocaine. Family history, does not know his father. Mother has some kind of arthritis, no heart disease.    HPI  History reviewed. No pertinent past medical history.   History reviewed. No pertinent surgical history.     Home Medications    Prior to Admission medications   Medication Sig Start Date End Date Taking? Authorizing Provider  albuterol (PROVENTIL HFA;VENTOLIN HFA) 108 (90 Base) MCG/ACT inhaler Inhale 1-2 puffs into the lungs every 4 (four) hours as needed for wheezing or shortness of breath. 04/29/17   Eustace Moore, MD  meloxicam (MOBIC) 15 MG tablet Take 1 tablet (15 mg total) by mouth daily. 02/15/17   Chinita Pester, FNP  omeprazole (PRILOSEC) 40 MG capsule Take 1 capsule (40 mg total) by mouth 2 (two) times daily before a meal. 04/29/17 05/29/17  Eustace Moore, MD  triamcinolone (NASACORT) 55 MCG/ACT AERO nasal inhaler Place 2 sprays into the nose daily. 04/29/17   Eustace Moore, MD    Family History History reviewed. No pertinent family history.  Social History Social History  Substance Use Topics  . Smoking status: Current Every Day Smoker  . Smokeless tobacco: Never Used  . Alcohol use No      Allergies   Patient has no known allergies.   Review of Systems Review of Systems  All other systems reviewed and are negative.    Physical Exam Triage Vital Signs ED Triage Vitals  Enc Vitals Group     BP 04/29/17 1532 (!) 119/47     Pulse Rate 04/29/17 1532 77     Resp 04/29/17 1532 20     Temp 04/29/17 1532 98.3 F (36.8 C)     Temp Source 04/29/17 1532 Oral     SpO2 04/29/17 1532 99 %     Weight --      Height --      Pain Score 04/29/17 1534 4     Pain Loc --    Updated Vital Signs BP (!) 119/47 (BP Location: Left Arm)   Pulse 77   Temp 98.3 F (36.8 C) (Oral)   Resp 20   SpO2 99%   Physical Exam  Constitutional: He is oriented to person, place, and time. No distress.  Alert, nicely groomed  HENT:  Head: Atraumatic.  Bilateral ears are waxy, right is occluded. The left TM is moderately dull, but no erythema Moderately severe nasal congestion bilaterally Throat is slightly injected  Eyes:  Conjugate gaze, no eye redness/drainage  Neck: Neck supple.  Cardiovascular: Normal rate and regular rhythm.   Pulmonary/Chest: No respiratory distress. He has no wheezes. He  has no rales.  Coarse but symmetric breath sounds throughout  Abdominal: He exhibits no distension.  Musculoskeletal: Normal range of motion. He exhibits no edema.  Neurological: He is alert and oriented to person, place, and time.  Skin: Skin is warm and dry.  No cyanosis  Nursing note and vitals reviewed.    UC Treatments / Results   EKG NSR, no acute appearing ST or T wave changes, does have NSST changes  Procedures Procedures (including critical care time)  Medications Ordered in UC Medications  ipratropium-albuterol (DUONEB) 0.5-2.5 (3) MG/3ML nebulizer solution 3 mL (3 mLs Nebulization Given 04/29/17 1640)     Final Clinical Impressions(s) / UC Diagnoses   Final diagnoses:  Acute bronchitis, unspecified organism  Atypical chest pain  Sinus congestion   No danger  signs on exam today. EKG did not show a heart attack today. Go to the emergency room if severe/sustained chest pain occurs. Breathing treatment was given in the urgent care today. Prescription for an albuterol inhaler and a nasal steroid inhaler were sent to the pharmacy, to help with symptoms of respiratory infection. Prescription for omeprazole, to help with stomach acid and hopefully with chest discomfort, was sent to the pharmacy. Continue efforts to establish with a primary care provider, to continue evaluation of symptoms. Note for work today and tomorrow.  Recheck for new fever >100.5, increasing phlegm production/nasal discharge, or if not starting to improve in a few days.     New Prescriptions New Prescriptions   ALBUTEROL (PROVENTIL HFA;VENTOLIN HFA) 108 (90 BASE) MCG/ACT INHALER    Inhale 1-2 puffs into the lungs every 4 (four) hours as needed for wheezing or shortness of breath.   OMEPRAZOLE (PRILOSEC) 40 MG CAPSULE    Take 1 capsule (40 mg total) by mouth 2 (two) times daily before a meal.   TRIAMCINOLONE (NASACORT) 55 MCG/ACT AERO NASAL INHALER    Place 2 sprays into the nose daily.     Controlled Substance Prescriptions [place] Controlled Substance Registry consulted? No   Eustace Moore, MD 05/01/17 [phone]

## 2017-06-28 IMAGING — DX DG KNEE COMPLETE 4+V*R*
4 series · 4 of 4 positions shown · non-contrast
Comparison: None.

CLINICAL DATA: Tripped over dog last night, RIGHT knee pain.

EXAM:
RIGHT KNEE - COMPLETE 4+ VIEW

[knee ap]
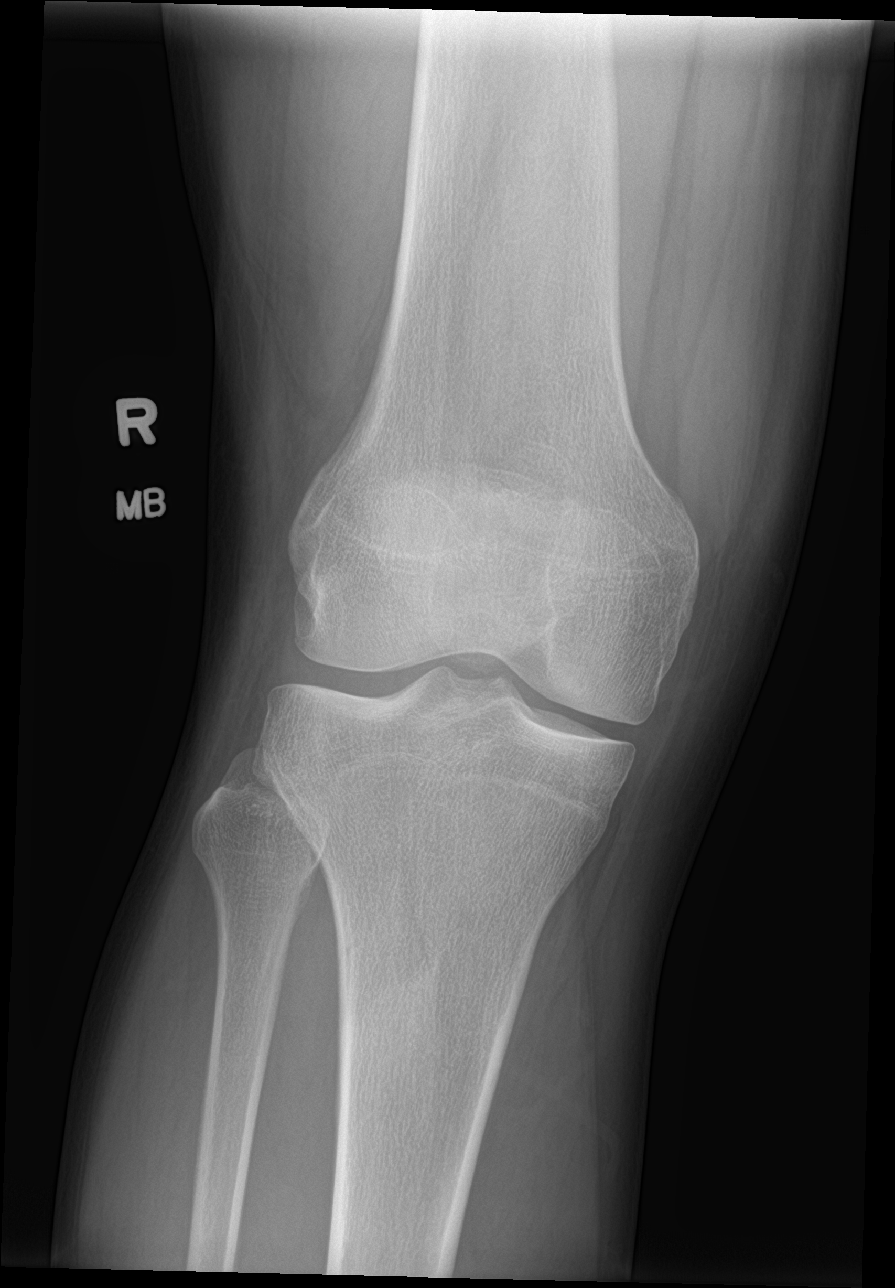

[knee obl (1 of 2)]
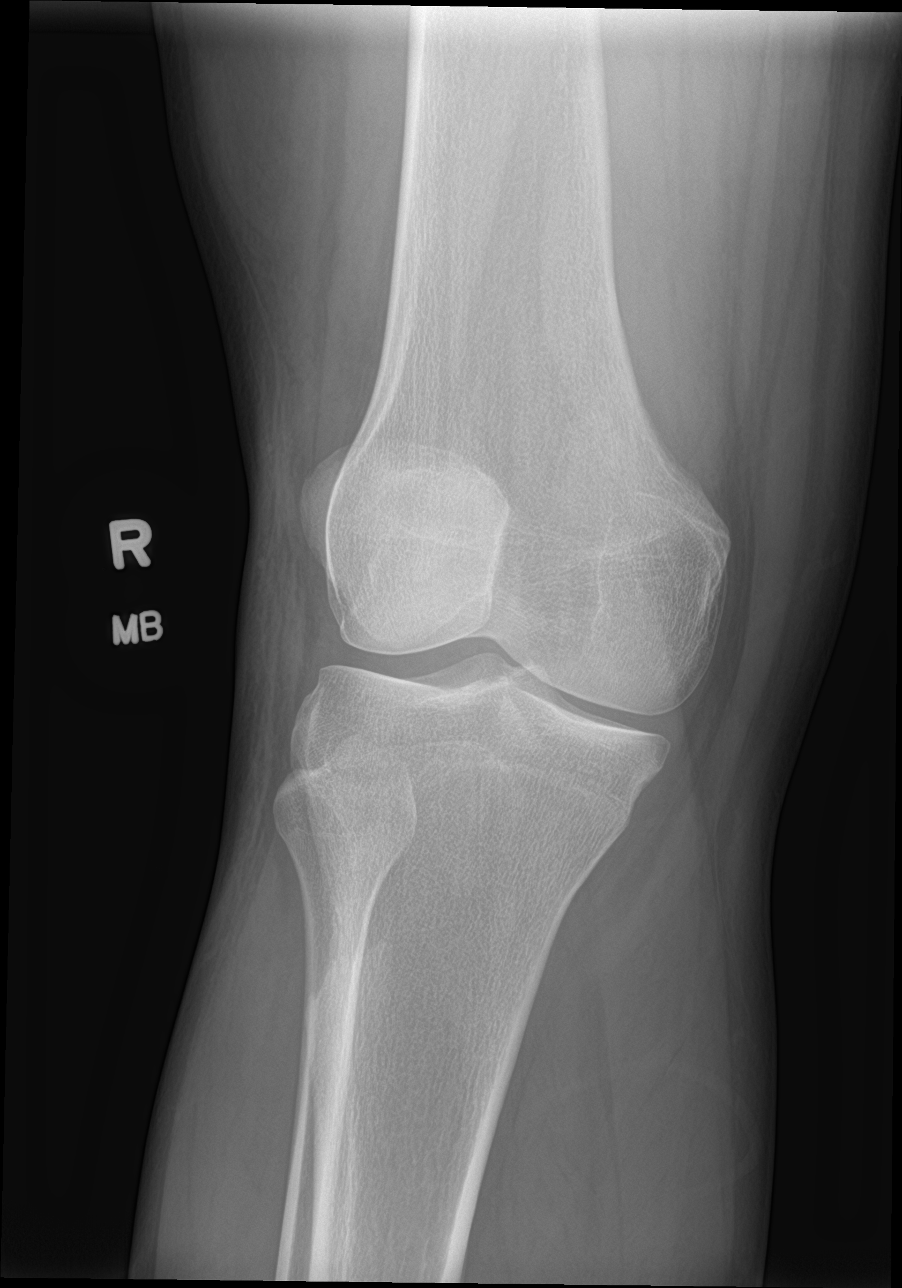

[knee obl (2 of 2)]
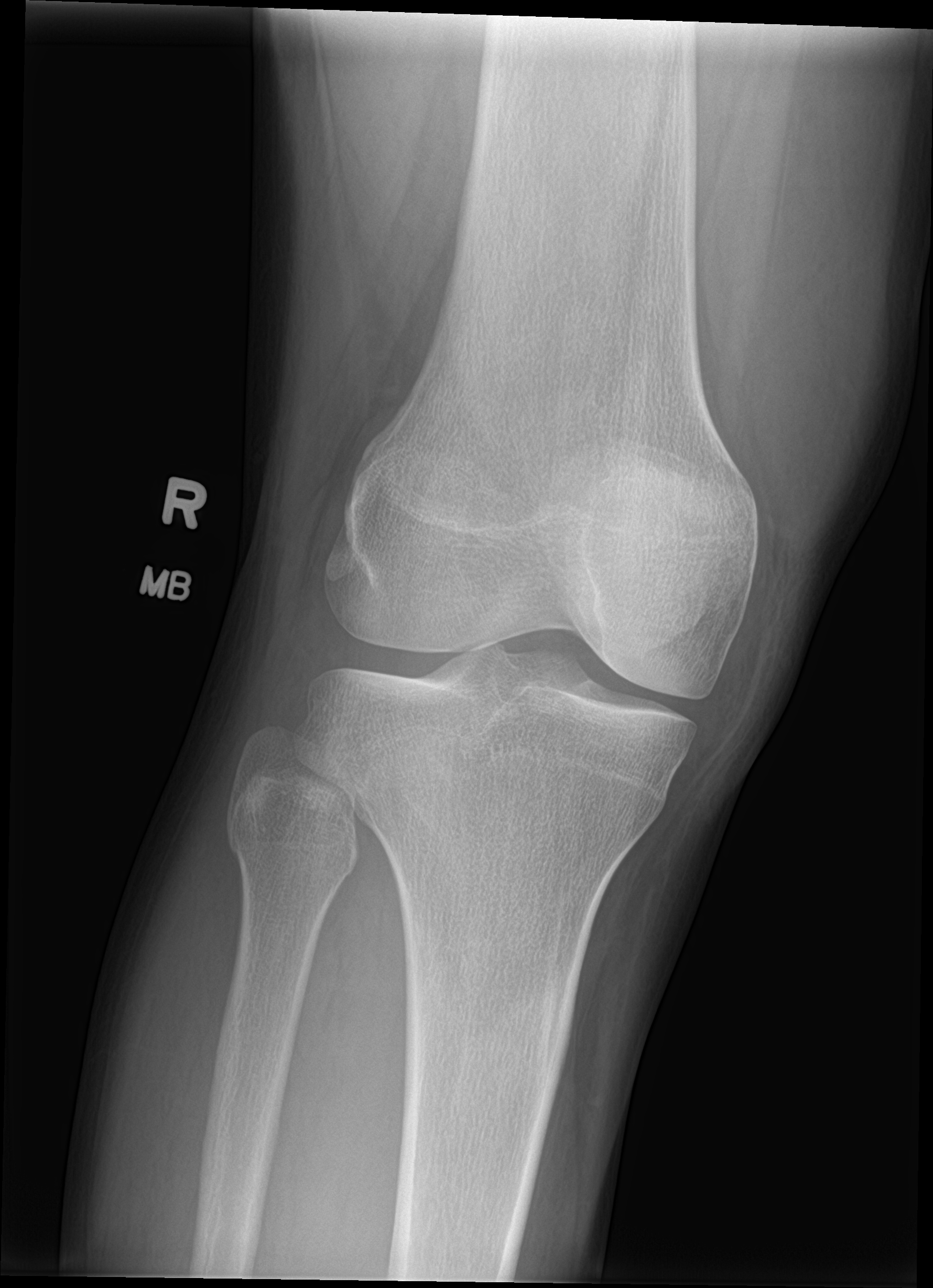

[knee lat]
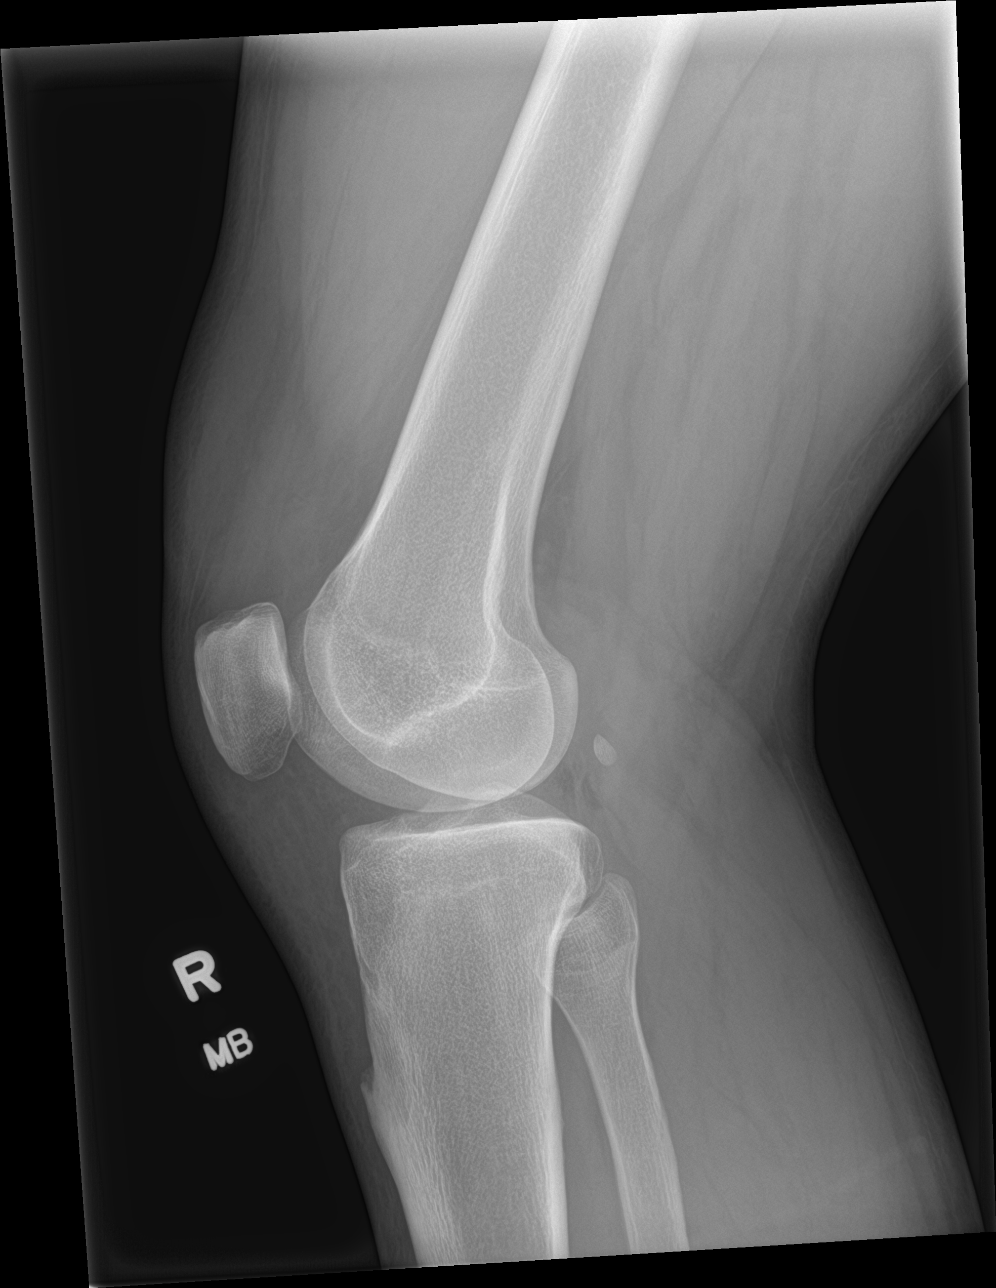

[4 of 4 positions shown; findings below may reference images not displayed]

FINDINGS: No acute fracture deformity or dislocation. Joint space intact
without erosions. No destructive bony lesions. Soft tissue planes
are not suspicious. Mild prepatellar soft tissue swelling.
IMPRESSION: Mild prepatellar soft tissue swelling without acute osseous process.

## 2017-12-12 ENCOUNTER — Ambulatory Visit (HOSPITAL_COMMUNITY)
Admission: EM | Admit: 2017-12-12 | Discharge: 2017-12-12 | Disposition: A | Discharge status: Home / Self Care | Payer: Managed Care, Other (non HMO) | Attending: Family Medicine | Admitting: Family Medicine

## 2017-12-12 ENCOUNTER — Encounter (HOSPITAL_COMMUNITY): Payer: Self-pay | Admitting: Emergency Medicine

## 2017-12-12 DIAGNOSIS — M5432 Sciatica, left side: Secondary | ICD-10-CM

## 2017-12-12 HISTORY — DX: Attention-deficit hyperactivity disorder, unspecified type: F90.9

## 2017-12-12 MED ORDER — PREDNISONE 10 MG PO TABS: 20.0000 mg | ORAL_TABLET | Freq: Every day | ORAL | 0 refills | Status: DC

## 2017-12-12 NOTE — ED Provider Notes (Signed)
[facility]    CSN: [id number] Arrival date & time: 12/12/17  1448     History   Chief Complaint Chief Complaint  Patient presents with  . Back Pain    HPI Jonathan Valdez is a 27 y.o. male.   Patient complains of back pain with radiation to the left hip and posterior thigh.  He would prefer to stand than sit because sitting increases pain.  He does lots of lifting at work.  He does have a history of left-sided back pain and was seen here several years for the ago for the same problem HPI  Past Medical History:  Diagnosis Date  . ADHD     There are no active problems to display for this patient.   History reviewed. No pertinent surgical history.     Home Medications    Prior to Admission medications   Medication Sig Start Date End Date Taking? Authorizing Provider  albuterol (PROVENTIL HFA;VENTOLIN HFA) 108 (90 Base) MCG/ACT inhaler Inhale 1-2 puffs into the lungs every 4 (four) hours as needed for wheezing or shortness of breath. 04/29/17   Isa Rankin, MD  meloxicam (MOBIC) 15 MG tablet Take 1 tablet (15 mg total) by mouth daily. 02/15/17   Chinita Pester, FNP  omeprazole (PRILOSEC) 40 MG capsule Take 1 capsule (40 mg total) by mouth 2 (two) times daily before a meal. 04/29/17 05/29/17  Isa Rankin, MD  triamcinolone (NASACORT) 55 MCG/ACT AERO nasal inhaler Place 2 sprays into the nose daily. 04/29/17   Isa Rankin, MD    Family History No family history on file.  Social History Social History   Tobacco Use  . Smoking status: Current Every Day Smoker  . Smokeless tobacco: Never Used  Substance Use Topics  . Alcohol use: No  . Drug use: No     Allergies   Patient has no known allergies.   Review of Systems Review of Systems  Constitutional: Negative for chills and fever.  HENT: Negative for ear pain and sore throat.   Eyes: Negative for pain and visual disturbance.  Respiratory: Negative for cough and shortness of  breath.   Cardiovascular: Negative for chest pain and palpitations.  Gastrointestinal: Negative for abdominal pain and vomiting.  Genitourinary: Negative for dysuria and hematuria.  Musculoskeletal: Positive for back pain and gait problem. Negative for arthralgias.  Skin: Negative for color change and rash.  Neurological: Negative for seizures and syncope.  All other systems reviewed and are negative.    Physical Exam Triage Vital Signs ED Triage Vitals  Enc Vitals Group     BP 12/12/17 1509 (!) 129/97     Pulse Rate 12/12/17 1509 91     Resp 12/12/17 1509 16     Temp 12/12/17 1509 98.3 F (36.8 C)     Temp Source 12/12/17 1509 Oral     SpO2 12/12/17 1509 100 %     Weight 12/12/17 1510 287 lb (130.2 kg)     Height --      Head Circumference --      Peak Flow --      Pain Score 12/12/17 1510 7     Pain Loc --      Pain Edu? --      Excl. in GC? --    No data found.  Updated Vital Signs BP (!) 129/97   Pulse 91   Temp 98.3 F (36.8 C) (Oral)   Resp 16   Wt 287 lb (  130.2 kg)   SpO2 100%   BMI 33.17 kg/m   Visual Acuity Right Eye Distance:   Left Eye Distance:   Bilateral Distance:    Right Eye Near:   Left Eye Near:    Bilateral Near:     Physical Exam  Constitutional: He appears well-developed and well-nourished.  Cardiovascular: Normal rate and regular rhythm.  Pulmonary/Chest: Effort normal.  Musculoskeletal:  Back: Marked decreased ability to flex the lateral bending and extension are within normal limits.  Reflexes are symmetric at knees and ankles. Straight leg raising is positive on the left. There is tenderness over the sciatic notch with deep palpation.     UC Treatments / Results  Labs (all labs ordered are listed, but only abnormal results are displayed) Labs Reviewed - No data to display  EKG None  Radiology No results found.  Procedures Procedures (including critical care time)  Medications Ordered in UC Medications - No data to  display  Initial Impression / Assessment and Plan / UC Course  I have reviewed the triage vital signs and the nursing notes.  Pertinent labs & imaging results that were available during my care of the patient were reviewed by me and considered in my medical decision making (see chart for details).     Back pain with sciatica.  Will put him on light duty at work.  Taper dose of prednisone.  If symptoms worsen or progress down his leg he may need imaging studies. Final Clinical Impressions(s) / UC Diagnoses   Final diagnoses:  None   Discharge Instructions   None    ED Prescriptions    None     Controlled Substance Prescriptions [place] Controlled Substance Registry consulted? No   Frederica Kuster, MD 12/12/17 [phone]

## 2017-12-12 NOTE — ED Triage Notes (Signed)
PT reports worsening back pain over past 2 weeks. PT does heavy lifting at work.

## 2017-12-17 ENCOUNTER — Encounter (HOSPITAL_COMMUNITY): Payer: Self-pay | Admitting: Emergency Medicine

## 2017-12-17 ENCOUNTER — Ambulatory Visit (HOSPITAL_COMMUNITY)
Admission: EM | Admit: 2017-12-17 | Discharge: 2017-12-17 | Disposition: A | Discharge status: Home / Self Care | Payer: Self-pay | Attending: Urgent Care | Admitting: Urgent Care

## 2017-12-17 DIAGNOSIS — R200 Anesthesia of skin: Secondary | ICD-10-CM

## 2017-12-17 DIAGNOSIS — M5432 Sciatica, left side: Secondary | ICD-10-CM

## 2017-12-17 DIAGNOSIS — M5442 Lumbago with sciatica, left side: Secondary | ICD-10-CM

## 2017-12-17 DIAGNOSIS — R202 Paresthesia of skin: Secondary | ICD-10-CM

## 2017-12-17 MED ORDER — METHYLPREDNISOLONE ACETATE 80 MG/ML IJ SUSP
80.0000 mg | Freq: Once | INTRAMUSCULAR | Status: AC
  Administered 2017-12-17: 80 mg via INTRAMUSCULAR

## 2017-12-17 MED ORDER — METHYLPREDNISOLONE ACETATE 80 MG/ML IJ SUSP
INTRAMUSCULAR | Status: AC
  Filled 2017-12-17: qty 1

## 2017-12-17 MED ORDER — CYCLOBENZAPRINE HCL 5 MG PO TABS: 5.0000 mg | ORAL_TABLET | Freq: Three times a day (TID) | ORAL | 0 refills | Status: DC | PRN

## 2017-12-17 NOTE — ED Triage Notes (Signed)
Pt states he was seen here a few days ago for back pain dx with sciatica, given script for prednisone and hasn't been able to afford it. Pt unable to work with the back pain.

## 2017-12-17 NOTE — ED Provider Notes (Signed)
  MRN: [id number] DOB: 1991-03-30  Subjective:   Wilburn MylarCole Cervenka is a 27 y.o. male presenting for worsening sciatica. He is still having persistent left-sided back pain, radiates down to his left leg, has tingling/numbness of his feet. Reports intermittent low back pain now. His employer sent him home from work to get a work note. He was seen on 12/12/2017 and prescribed prednisone which the patient was unable to fill due to cost. Denies weakness, hematuria, dysuria, anuria, straining to urinate, falls, trauma, constipation.   No current facility-administered medications for this encounter.   Current Outpatient Medications:  .  albuterol (PROVENTIL HFA;VENTOLIN HFA) 108 (90 Base) MCG/ACT inhaler, Inhale 1-2 puffs into the lungs every 4 (four) hours as needed for wheezing or shortness of breath., Disp: 1 Inhaler, Rfl: 0 .  meloxicam (MOBIC) 15 MG tablet, Take 1 tablet (15 mg total) by mouth daily., Disp: 30 tablet, Rfl: 0 .  omeprazole (PRILOSEC) 40 MG capsule, Take 1 capsule (40 mg total) by mouth 2 (two) times daily before a meal., Disp: 60 capsule, Rfl: 1 .  predniSONE (DELTASONE) 10 MG tablet, Take 2 tablets (20 mg total) by mouth daily. Take 3 tablets x 2 days, 2 tablets x 2 days 1 tablet x 2 days, Disp: 15 tablet, Rfl: 0 .  triamcinolone (NASACORT) 55 MCG/ACT AERO nasal inhaler, Place 2 sprays into the nose daily., Disp: 1 Inhaler, Rfl: 0   No Known Allergies  Past Medical History:  Diagnosis Date  . ADHD     Denies past surgical history. Denies family history of diabetes from his mother's side and does not know his father's side.   Objective:   Vitals: BP 126/78   Pulse 79   Temp 98 F (36.7 C)   Resp 18   SpO2 100%   Physical Exam  Constitutional: He is oriented to person, place, and time. He appears well-developed and well-nourished.  Cardiovascular: Normal rate.  Pulmonary/Chest: Effort normal.  Neurological: He is alert and oriented to person, place, and time. Coordination  (moving very gingerly favoring his back, rises slowly from a seated or lying position) abnormal.   Assessment and Plan :   Sciatica of left side  Acute bilateral low back pain with left-sided sciatica  Numbness and tingling of both feet  IM Depo-Medrol in clinic.  Patient is to use Flexeril as well for muscle relaxing properties.  Wrote a work note for patient to return to work on Wednesday.  He is to follow-up as needed.    Wallis BambergMani, Jaysten Essner, [place]PA-C 12/17/17 1657

## 2017-12-17 NOTE — Discharge Instructions (Addendum)
Hydrate well with at least 2 liters (1 gallon) of water daily. Do not take any other NSAID including diclofenac, ibuprofen, naproxen, Aleve, Motrin, etc while taking prednisone.   °

## 2017-12-21 ENCOUNTER — Ambulatory Visit (HOSPITAL_COMMUNITY)
Admission: EM | Admit: 2017-12-21 | Discharge: 2017-12-21 | Disposition: A | Discharge status: Home / Self Care | Payer: Self-pay | Attending: Family Medicine | Admitting: Family Medicine

## 2017-12-21 ENCOUNTER — Encounter (HOSPITAL_COMMUNITY): Payer: Self-pay | Admitting: Family Medicine

## 2017-12-21 DIAGNOSIS — S39012S Strain of muscle, fascia and tendon of lower back, sequela: Secondary | ICD-10-CM

## 2017-12-21 NOTE — ED Triage Notes (Signed)
Pt here for continued back pain. sts that the steroids helped but the pain is still there. He is unable to work due to the pain. sts the only thing that helps is laying flat. sts taking flexeril at night.

## 2017-12-21 NOTE — ED Provider Notes (Signed)
[facility]    CSN: [id number] Arrival date & time: 12/21/17  1835     History   Chief Complaint Chief Complaint  Patient presents with  . Back Pain    HPI Jonathan Valdez is a 27 y.o. male.   Jonathan Valdez presents with complaints of persistent bilateral low back pain. Has been seen twice for this, last on 6/2. Was given a depo medrol injection and pain has since improved. States still with quite significant pain therefore requesting further recommendations. Currently does not have insurance so does not have a PCP. No specific known injury. States he has recently put on weight, and does a lot of lifting at his work. No longer with radiation of pain down his leg. Pain is worse with long periods of sitting. Has been trying to stretch more. Occasionally his feet feel numb. No saddle paresthesia. No urinary or stool incontinence. Hx of adhd.    ROS per HPI.      Past Medical History:  Diagnosis Date  . ADHD     There are no active problems to display for this patient.   History reviewed. No pertinent surgical history.     Home Medications    Prior to Admission medications   Medication Sig Start Date End Date Taking? Authorizing Provider  albuterol (PROVENTIL HFA;VENTOLIN HFA) 108 (90 Base) MCG/ACT inhaler Inhale 1-2 puffs into the lungs every 4 (four) hours as needed for wheezing or shortness of breath. 04/29/17   Isa Rankin, MD  cyclobenzaprine (FLEXERIL) 5 MG tablet Take 1 tablet (5 mg total) by mouth 3 (three) times daily as needed for muscle spasms. 12/17/17   Wallis Bamberg, PA-C  meloxicam (MOBIC) 15 MG tablet Take 1 tablet (15 mg total) by mouth daily. 02/15/17   Chinita Pester, FNP  omeprazole (PRILOSEC) 40 MG capsule Take 1 capsule (40 mg total) by mouth 2 (two) times daily before a meal. 04/29/17 05/29/17  Isa Rankin, MD  triamcinolone (NASACORT) 55 MCG/ACT AERO nasal inhaler Place 2 sprays into the nose daily. 04/29/17   Isa Rankin, MD     Family History History reviewed. No pertinent family history.  Social History Social History   Tobacco Use  . Smoking status: Current Every Day Smoker  . Smokeless tobacco: Never Used  Substance Use Topics  . Alcohol use: No  . Drug use: No     Allergies   Patient has no known allergies.   Review of Systems Review of Systems   Physical Exam Triage Vital Signs ED Triage Vitals  Enc Vitals Group     BP 12/21/17 1926 125/75     Pulse Rate 12/21/17 1926 96     Resp 12/21/17 1926 18     Temp 12/21/17 1926 98.4 F (36.9 C)     Temp src --      SpO2 12/21/17 1926 100 %     Weight --      Height --      Head Circumference --      Peak Flow --      Pain Score 12/21/17 1925 6     Pain Loc --      Pain Edu? --      Excl. in GC? --    No data found.  Updated Vital Signs BP 125/75   Pulse 96   Temp 98.4 F (36.9 C)   Resp 18   SpO2 100%    Physical Exam  Constitutional: He is oriented to  person, place, and time. He appears well-developed and well-nourished.  Cardiovascular: Normal rate and regular rhythm.  Pulmonary/Chest: Effort normal and breath sounds normal.  Musculoskeletal:       Lumbar back: He exhibits decreased range of motion, tenderness and pain. He exhibits no bony tenderness, no swelling, no edema, no deformity, no laceration, no spasm and normal pulse.       Back:  Without spinous process tenderness; sensation intact to bilateral lower extremities; strength equal to bilateral lower extremities; ambulatory without difficulty  Neurological: He is alert and oriented to person, place, and time.  Skin: Skin is warm and dry.     UC Treatments / Results  Labs (all labs ordered are listed, but only abnormal results are displayed) Labs Reviewed - No data to display  EKG None  Radiology No results found.  Procedures Procedures (including critical care time)  Medications Ordered in UC Medications - No data to display  Initial Impression  / Assessment and Plan / UC Course  I have reviewed the triage vital signs and the nursing notes.  Pertinent labs & imaging results that were available during my care of the patient were reviewed by me and considered in my medical decision making (see chart for details).     Patient has course of prednisone he was prescribed but wasn't able to initially fill. Discussed that he may now take this course to further help with pain. Light and regular activity. Increase core strength as able, exercises provided and recommended. Encouraged establish with PCP. May follow with occ health if need further work restrict. Patient verbalized understanding and agreeable to plan.  Ambulatory out of clinic without difficulty.    Final Clinical Impressions(s) / UC Diagnoses   Final diagnoses:  Strain of lumbar region, sequela     Discharge Instructions     May complete course of prednisone. Do not take additional ibuprofen or naproxen.  May take additional tylenol. Please see exercises and provide as tolerated.  May follow up with occupational health if further work restrictions needed.    ED Prescriptions    None     Controlled Substance Prescriptions [place] Controlled Substance Registry consulted? Not Applicable   Georgetta HaberBurky, Darah Simkin B, NP 12/21/17 2135

## 2017-12-21 NOTE — Discharge Instructions (Signed)
May complete course of prednisone. Do not take additional ibuprofen or naproxen.  May take additional tylenol. Please see exercises and provide as tolerated.  May follow up with occupational health if further work restrictions needed.

## 2019-11-06 ENCOUNTER — Ambulatory Visit: Payer: Self-pay | Admitting: Family Medicine

## 2019-11-29 ENCOUNTER — Ambulatory Visit: Payer: Self-pay | Admitting: Family Medicine

## 2020-01-01 ENCOUNTER — Ambulatory Visit: Payer: Self-pay | Admitting: Family Medicine

## 2020-01-01 ENCOUNTER — Telehealth: Payer: Self-pay

## 2020-01-01 NOTE — Telephone Encounter (Signed)
Per appt notes pt has already been rescheduled.

## 2020-01-01 NOTE — Telephone Encounter (Signed)
[facility] Night - Client Nonclinical Telephone Record AccessNurse Client [facility] Night - Client Client Site [facility] - Night Contact Type Call Who Is Calling Patient / Member / Family / Caregiver Caller Name Addie Cederberg Caller Phone Number [phone] Patient Name Jonathan Valdez Patient DOB [birth year] Call Type Message Only Information Provided Reason for Call Request to Reschedule Office Appointment Initial Comment Caller states he has an appointment tomorrow, that he is needing to get rescheduled. Additional Comment Office hours provided. Caller is not an established patient. Disp. Time Disposition Final User 12/31/2019 6:19:15 PM General Information Provided Yes Emeline Gins Call Closed By: Emeline Gins Transaction Date/Time: 12/31/2019 6:16:51 PM (ET)

## 2020-02-07 ENCOUNTER — Other Ambulatory Visit: Payer: Self-pay

## 2020-02-07 ENCOUNTER — Encounter: Payer: Self-pay | Admitting: Family Medicine

## 2020-02-07 ENCOUNTER — Ambulatory Visit (INDEPENDENT_AMBULATORY_CARE_PROVIDER_SITE_OTHER): Payer: BLUE CROSS/BLUE SHIELD | Admitting: Family Medicine

## 2020-02-07 VITALS — BP 124/80 | HR 103 | Temp 97.2°F | Ht 75.5 in | Wt 286.1 lb

## 2020-02-07 DIAGNOSIS — E559 Vitamin D deficiency, unspecified: Secondary | ICD-10-CM

## 2020-02-07 DIAGNOSIS — E6609 Other obesity due to excess calories: Secondary | ICD-10-CM | POA: Diagnosis not present

## 2020-02-07 DIAGNOSIS — Z6835 Body mass index (BMI) 35.0-35.9, adult: Secondary | ICD-10-CM

## 2020-02-07 LAB — COMPREHENSIVE METABOLIC PANEL
CO2: 27 mmol/L (ref 20–32)
Creat: 0.97 mg/dL (ref 0.60–1.35)
Sodium: 138 mmol/L (ref 135–146)

## 2020-02-07 LAB — LIPID PANEL: LDL Cholesterol (Calc): 151 mg/dL (calc) — ABNORMAL HIGH

## 2020-02-07 NOTE — Patient Instructions (Signed)
Good to see you today  Please follow up for your complete physical in 6 months  Diet resources-   [email or web address]  Intermittent fasting/ low carb- books and youtube, Dr. Harley Hallmark, Dr. Shanda Howells

## 2020-02-07 NOTE — Progress Notes (Signed)
Subjective:    Patient ID: Jonathan Valdez, male    DOB: [birth year], 29 y.o.   MRN: [id number]  HPI Chief Complaint  Patient presents with  . New Patient (Initial Visit)    Estab Care, discuss weight loss   This is a 29 yo male who presents today to establish care and to discuss weight loss.  Not seeing anyone for primary care. Going to [facility]. 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   Last CPE- last physical over a decade ago Tdap- 11/24/2004 Flu- not regularly Dental- not regular Eye- last year Exercise- lifts weights 5x/ week, a lot of walking Diet- would like to lose weight. Has bloating. Used to weight 397. Cuts carbs to lose weight. Drinks 2-3 diet sodas and energy drinks 1x or more a day.   Flank pain- intermittent, ? Always on right side. Pain lasts for 15 minutes or so. Usually improved with drinking water. Rare episodes, less than monthly.   Currently going to school and working full time. Had difficult childhood. In counseling. Not getting much sleep.   Review of Systems No chest pain, SOB, diarrhea/ constipation, leg swelling, muscle or joint pain.     Objective:   Physical Exam Vitals reviewed.  Constitutional:      General: He is not in acute distress.    Appearance: Normal appearance. He is obese. He is not ill-appearing, toxic-appearing or diaphoretic.  HENT:     Head: Normocephalic and atraumatic.  Eyes:     Conjunctiva/sclera: Conjunctivae normal.  Cardiovascular:     Rate and Rhythm: Normal rate and regular rhythm.     Heart sounds: Normal heart sounds.  Pulmonary:     Effort: Pulmonary effort is normal.     Breath sounds: Normal breath sounds.  Musculoskeletal:     Cervical back: Normal range of motion and neck supple.     Right lower leg: No edema.     Left lower leg: No edema.  Skin:    General: Skin is warm and dry.  Neurological:     Mental Status: He is alert and oriented to person, place,  and time.  Psychiatric:        Mood and Affect: Mood normal.        Behavior: Behavior normal.        Thought Content: Thought content normal.        Judgment: Judgment normal.       BP 124/80 (BP Location: Left Arm, Patient Position: Sitting, Cuff Size: Large)   Pulse 103   Temp (!) 97.2 F (36.2 C) (Temporal)   Ht 6' 3.5" (1.918 m)   Wt (!) 286 lb 1.9 oz (129.8 kg)   SpO2 98%   BMI 35.29 kg/m  Wt Readings from Last 3 Encounters:  02/07/20 (!) 286 lb 1.9 oz (129.8 kg)  12/12/17 287 lb (130.2 kg)  02/15/17 280 lb (127 kg)   Depression screen PHQ 2/9 02/07/2020  Decreased Interest 0  Down, Depressed, Hopeless 0  PHQ - 2 Score 0       Assessment & Plan:  1. Class 2 obesity due to excess calories without serious comorbidity with body mass index (BMI) of 35.0 to 35.9 in adult - has had significant weight loss of 100 pounds, encouraged him to continue to make healthy food choices, low/ healthy carb, avoid over exercising, excessive artifical sweeteners/ energy drinks.  - TSH - Comprehensive metabolic panel - CBC with Differential -  Lipid Panel - Vitamin D, 25-hydroxy - Hemoglobin A1c - follow up in 3 months for CPE  2. Vitamin D deficiency - Cholecalciferol (VITAMIN D3) 1.25 MG (50000 UT) TABS; Take 1 tablet by mouth every 7 (seven) days.  Dispense: 12 tablet; Refill: 3  This visit occurred during the SARS-CoV-2 public health emergency.  Safety protocols were in place, including screening questions prior to the visit, additional usage of staff PPE, and extensive cleaning of exam room while observing appropriate contact time as indicated for disinfecting solutions.    Olean Ree, FNP-BC  [facility], [facility]  02/10/2020 9:15 AM

## 2020-02-08 LAB — LIPID PANEL
Cholesterol: 247 mg/dL — ABNORMAL HIGH (ref <200)
HDL: 47 mg/dL (ref >=40)
Non-HDL Cholesterol (Calc): 200 mg/dL (calc) — ABNORMAL HIGH (ref <130)
Total CHOL/HDL Ratio: 5.3 (calc) — ABNORMAL HIGH (ref <5.0)
Triglycerides: 319 mg/dL — ABNORMAL HIGH (ref <150)

## 2020-02-08 LAB — CBC WITH DIFFERENTIAL/PLATELET
Absolute Monocytes: 1074 cells/uL — ABNORMAL HIGH (ref 200–950)
Basophils Absolute: 76 cells/uL (ref 0–200)
Basophils Relative: 0.8 %
Eosinophils Absolute: 266 cells/uL (ref 15–500)
Eosinophils Relative: 2.8 %
HCT: 47.2 % (ref 38.5–50.0)
Hemoglobin: 16 g/dL (ref 13.2–17.1)
Lymphs Abs: 3354 cells/uL (ref 850–3900)
MCH: 28.3 pg (ref 27.0–33.0)
MCHC: 33.9 g/dL (ref 32.0–36.0)
MCV: 83.4 fL (ref 80.0–100.0)
MPV: 8.5 fL (ref 7.5–12.5)
Monocytes Relative: 11.3 %
Neutro Abs: 4731 cells/uL (ref 1500–7800)
Neutrophils Relative %: 49.8 %
Platelets: 400 10*3/uL (ref 140–400)
RBC: 5.66 10*6/uL (ref 4.20–5.80)
RDW: 12.9 % (ref 11.0–15.0)
Total Lymphocyte: 35.3 %
WBC: 9.5 10*3/uL (ref 3.8–10.8)

## 2020-02-08 LAB — COMPREHENSIVE METABOLIC PANEL
AG Ratio: 2 (calc) (ref 1.0–2.5)
ALT: 80 U/L — ABNORMAL HIGH (ref 9–46)
AST: 93 U/L — ABNORMAL HIGH (ref 10–40)
Albumin: 5.3 g/dL — ABNORMAL HIGH (ref 3.6–5.1)
Alkaline phosphatase (APISO): 40 U/L (ref 36–130)
BUN: 15 mg/dL (ref 7–25)
Calcium: 10.6 mg/dL — ABNORMAL HIGH (ref 8.6–10.3)
Chloride: 103 mmol/L (ref 98–110)
Globulin: 2.7 g/dL (calc) (ref 1.9–3.7)
Glucose, Bld: 94 mg/dL (ref 65–99)
Potassium: 4.6 mmol/L (ref 3.5–5.3)
Total Bilirubin: 0.4 mg/dL (ref 0.2–1.2)
Total Protein: 8 g/dL (ref 6.1–8.1)

## 2020-02-08 LAB — HEMOGLOBIN A1C
Hgb A1c MFr Bld: 5.1 % of total Hgb (ref <5.7)
Mean Plasma Glucose: 100 (calc)
eAG (mmol/L): 5.5 (calc)

## 2020-02-08 LAB — VITAMIN D 25 HYDROXY (VIT D DEFICIENCY, FRACTURES): Vit D, 25-Hydroxy: 19 ng/mL — ABNORMAL LOW (ref 30–100)

## 2020-02-08 LAB — TSH: TSH: 2.78 mIU/L (ref 0.40–4.50)

## 2020-02-10 ENCOUNTER — Encounter: Payer: Self-pay | Admitting: Family Medicine

## 2020-02-10 MED ORDER — VITAMIN D3 1.25 MG (50000 UT) PO TABS: 1.0000 | ORAL_TABLET | ORAL | 3 refills | Status: DC

## 2020-11-19 ENCOUNTER — Ambulatory Visit (HOSPITAL_COMMUNITY)
Admission: EM | Admit: 2020-11-19 | Discharge: 2020-11-19 | Disposition: A | Discharge status: Home / Self Care | Payer: BLUE CROSS/BLUE SHIELD | Attending: Internal Medicine | Admitting: Internal Medicine

## 2020-11-19 ENCOUNTER — Other Ambulatory Visit: Payer: Self-pay

## 2020-11-19 ENCOUNTER — Encounter (HOSPITAL_COMMUNITY): Payer: Self-pay

## 2020-11-19 DIAGNOSIS — M7042 Prepatellar bursitis, left knee: Secondary | ICD-10-CM | POA: Insufficient documentation

## 2020-11-19 MED ORDER — IBUPROFEN 600 MG PO TABS: 600.0000 mg | ORAL_TABLET | Freq: Four times a day (QID) | ORAL | 0 refills | Status: AC | PRN

## 2020-11-19 NOTE — Discharge Instructions (Addendum)
Gentle range of motion exercises Take ibuprofen as needed for pain Icing of the knee Rest. If you experience worsening swelling, redness, drainage please return to urgent care to be reevaluated If this becomes a recurrent issue please go to the orthopedic urgent care to be evaluated further.

## 2020-11-19 NOTE — ED Triage Notes (Addendum)
Pt with swelling and pain to left knee. Pt does not remember any specific injury. Pt first noticed the swelling a week ago and has been worse in the past 4-5 days. Soft protuberance noted to left knee. Pedal pulse 1+. Pt reports pain when putting pressure on knee, and reports burning when pulling son up a hill in a wagon 2 nights ago.  Pt states both ankles have been hurting him as well and feels this may be related to trying not to put pressure on knee.

## 2020-11-20 LAB — SYNOVIAL CELL COUNT + DIFF, W/ CRYSTALS
Crystals, Fluid: NONE SEEN
Eosinophils-Synovial: 1 % (ref 0–1)
Lymphocytes-Synovial Fld: 75 % — ABNORMAL HIGH (ref 0–20)
Monocyte-Macrophage-Synovial Fluid: 10 % — ABNORMAL LOW (ref 50–90)
Neutrophil, Synovial: 14 % (ref 0–25)
WBC, Synovial: 16800 /mm3 — ABNORMAL HIGH (ref 0–200)

## 2020-11-23 NOTE — ED Provider Notes (Signed)
[facility]    CSN: [id number] Arrival date & time: 11/19/20  1709      History   Chief Complaint Chief Complaint  Patient presents with  . Joint Swelling    HPI Jonathan Valdez is a 30 y.o. male comes to urgent care with complaints of left knee pain with swelling.  Swelling was noticed about a week ago and has been worsening.  Patient denies any significant pain.  No known relieving factors.  Is aggravated or usually gets worse when he is pulling his hands while going up a hill.  No trauma.  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  He does not use any kneepads or knee protection equipment.  No fever or chills.  He is in a monogamous relationship.Marland Kitchen   HPI  Past Medical History:  Diagnosis Date  . ADHD     Patient Active Problem List   Diagnosis Date Noted  . 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 Attention deficit hyperactivity disorder (ADHD) 05/30/2008  . Abnormal weight gain 12/24/2007  . Irritable bowel syndrome 12/24/2007    History reviewed. No pertinent surgical history.     Home Medications    Prior to Admission medications   Medication Sig Start Date End Date Taking? Authorizing Provider  Amphetamine ER 18.8 MG TBED Take 1 tablet by mouth in the morning and at bedtime.   Yes [provider]  amphetamine-dextroamphetamine (ADDERALL XR) 10 MG 24 hr capsule Take 10 mg by mouth daily as needed. 01/24/20  Yes [provider]  cloNIDine (CATAPRES) 0.1 MG tablet Take 0.1 mg by mouth daily.   Yes [provider]  ibuprofen (ADVIL) 600 MG tablet Take 1 tablet (600 mg total) by mouth every 6 (six) hours as needed. 11/19/20  Yes Jezreel Justiniano, Britta Mccreedy, MD    Family History Family History  Problem Relation Age of Onset  . Breast cancer Maternal Grandmother     Social History Social History   Tobacco Use  . Smoking status: Former Games developer  .  Smokeless tobacco: Never Used  . Tobacco comment: Pt states he smokes less than one cig a week  Vaping Use  . Vaping Use: Every day  . Devices: Delta8  Substance Use Topics  . Alcohol use: Yes    Comment: occasionally   . Drug use: No    Comment: Delta 8 - varient of [place] - legal limit- occasionally     Allergies   Patient has no known allergies.   Review of Systems Review of Systems  Constitutional: Negative.   Gastrointestinal: Negative.   Musculoskeletal: Positive for arthralgias and joint swelling. Negative for myalgias.  Neurological: Negative.      Physical Exam Triage Vital Signs ED Triage Vitals  Enc Vitals Group     BP 11/19/20 1733 (!) 141/74     Pulse Rate 11/19/20 1733 95     Resp 11/19/20 1733 18     Temp 11/19/20 1733 98.1 F (36.7 C)     Temp src --      SpO2 11/19/20 1733 98 %     Weight --      Height --      Head Circumference --      Peak Flow --      Pain Score 11/19/20 1727 5     Pain Loc --      Pain Edu? --  Excl. in GC? --    No data found.  Updated Vital Signs BP (!) 141/74   Pulse 95   Temp 98.1 F (36.7 C)   Resp 18   SpO2 98%   Visual Acuity Right Eye Distance:   Left Eye Distance:   Bilateral Distance:    Right Eye Near:   Left Eye Near:    Bilateral Near:     Physical Exam Vitals and nursing note reviewed.  Constitutional:      General: He is not in acute distress.    Appearance: He is not ill-appearing.  Cardiovascular:     Pulses: Normal pulses.     Heart sounds: Normal heart sounds.  Musculoskeletal:        General: Normal range of motion.     Comments: Full range of motion around the left knee.  There is a firm prepatellar swelling the size of a ping-pong ball.  Swelling is freely mobile.  It is not erythematous.  No discharge from it.  Skin:    Capillary Refill: Capillary refill takes less than 2 seconds.  Neurological:     Mental Status: He is alert.      UC Treatments / Results   Labs (all labs ordered are listed, but only abnormal results are displayed) Labs Reviewed  SYNOVIAL CELL COUNT + DIFF, W/ CRYSTALS - Abnormal; Notable for the following components:      Result Value   Color, Synovial RED (*)    Appearance-Synovial CLOUDY (*)    WBC, Synovial 16,800 (*)    Lymphocytes-Synovial Fld 75 (*)    Monocyte-Macrophage-Synovial Fluid 10 (*)    All other components within normal limits    EKG   Radiology No results found.  Procedures Join Aspiration/Injection  Date/Time: 11/23/2020 9:22 AM Performed by: Merrilee Jansky, MD Authorized by: Merrilee Jansky, MD   Consent:    Consent obtained:  Verbal   Consent given by:  Patient   Risks, benefits, and alternatives were discussed: yes     Risks discussed:  Bleeding and infection Universal protocol:    Patient identity confirmed:  Verbally with patient Procedure details:    Needle gauge:  18 G   Approach:  Anterior   Aspirate characteristics:  Bloody   Steroid injected: no     Specimen collected: yes   Post-procedure details:    Dressing:  Adhesive bandage   Procedure completion:  Tolerated well, no immediate complications   (including critical care time)  Medications Ordered in UC Medications - No data to display  Initial Impression / Assessment and Plan / UC Course  I have reviewed the triage vital signs and the nursing notes.  Pertinent labs & imaging results that were available during my care of the patient were reviewed by me and considered in my medical decision making (see chart for details).     1.  Prepatellar bursitis of the left knee: Aspiration completed Synovial fluid analysis NSAIDs as needed Knee protection use at work is recommended If symptoms worsen please return to the urgent care Bursitis is likely secondary to repeated work-related trauma. Return precautions given Final Clinical Impressions(s) / UC Diagnoses   Final diagnoses:  Prepatellar bursitis of left  knee     Discharge Instructions     Gentle range of motion exercises Take ibuprofen as needed for pain Icing of the knee Rest. If you experience worsening swelling, redness, drainage please return to urgent care to be reevaluated If this becomes a recurrent  issue please go to the orthopedic urgent care to be evaluated further.   ED Prescriptions    Medication Sig Dispense Auth. Provider   ibuprofen (ADVIL) 600 MG tablet Take 1 tablet (600 mg total) by mouth every 6 (six) hours as needed. 30 tablet Infinity Jeffords, Britta Mccreedy, MD     PDMP not reviewed this encounter.   Merrilee Jansky, MD 11/23/20 [phone]

## 2021-10-28 ENCOUNTER — Ambulatory Visit
Admission: EM | Admit: 2021-10-28 | Discharge: 2021-10-28 | Disposition: A | Discharge status: Home / Self Care | Payer: BLUE CROSS/BLUE SHIELD | Attending: Emergency Medicine | Admitting: Emergency Medicine

## 2021-10-28 DIAGNOSIS — Z20818 Contact with and (suspected) exposure to other bacterial communicable diseases: Secondary | ICD-10-CM | POA: Diagnosis not present

## 2021-10-28 DIAGNOSIS — Z209 Contact with and (suspected) exposure to unspecified communicable disease: Secondary | ICD-10-CM | POA: Diagnosis not present

## 2021-10-28 LAB — POCT RAPID STREP A (OFFICE): Rapid Strep A Screen: NEGATIVE

## 2021-10-28 MED ORDER — CEFDINIR 300 MG PO CAPS: 300.0000 mg | ORAL_CAPSULE | Freq: Two times a day (BID) | ORAL | 0 refills | Status: AC

## 2021-10-28 MED ORDER — CIPROFLOXACIN HCL 0.3 % OP SOLN: OPHTHALMIC | 0 refills | Status: DC

## 2021-10-28 NOTE — ED Provider Notes (Signed)
?[facility] ? ? ? ?CSN: [id number] ?Arrival date & time: 10/28/21  1512 ?  ? ?HISTORY  ? ?Chief Complaint  ?Patient presents with  ? Sore Throat  ? Headache  ? ?HPI ?Jonathan Valdez is a 31 y.o. male. Patient complains of headache and sore throat that began today.  Patient states his children are currently taking antibiotics for strep throat and bacterial conjunctivitis. ? ?The history is provided by the patient.  ?Headache ?Past Medical History:  ?Diagnosis Date  ? ADHD   ? ?Patient Active Problem List  ? Diagnosis Date Noted  ? 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 Nondependent cannabis abuse, episodic 06/27/2008  ? Attention deficit hyperactivity disorder (ADHD) 05/30/2008  ? Abnormal weight gain 12/24/2007  ? Irritable bowel syndrome 12/24/2007  ? ?History reviewed. No pertinent surgical history. ? ?Home Medications   ? ?Prior to Admission medications   ?Medication Sig Start Date End Date Taking? Authorizing Provider  ?Amphetamine ER 18.8 MG TBED Take 1 tablet by mouth in the morning and at bedtime.    [provider]  ?amphetamine-dextroamphetamine (ADDERALL XR) 10 MG 24 hr capsule Take 10 mg by mouth daily as needed. 01/24/20   [provider]  ?cloNIDine (CATAPRES) 0.1 MG tablet Take 0.1 mg by mouth daily.    [provider]  ?ibuprofen (ADVIL) 600 MG tablet Take 1 tablet (600 mg total) by mouth every 6 (six) hours as needed. 11/19/20   Lamptey, Britta MccreedyPhilip O, MD  ? ?Family History ?Family History  ?Problem Relation Age of Onset  ? Breast cancer Maternal Grandmother   ? ?Social History ?Social History  ? ?Tobacco Use  ? Smoking status: Former  ? Smokeless tobacco: Never  ? Tobacco comments:  ?  Pt states he smokes less than one cig a week  ?Vaping Use  ? Vaping Use: Every day  ? Devices: Delta8  ?Substance Use Topics  ? Alcohol use: Yes  ?  Comment: occasionally   ? Drug use: No  ?  Comment: Delta 8 - varient of [place] - legal limit- occasionally   ? ?Allergies   ?Patient has no known allergies. ? ?Review of Systems ?Review of Systems  ?Neurological:  Positive for headaches.  ?Pertinent findings noted in history of present illness.  ? ?Physical Exam ?Triage Vital Signs ?ED Triage Vitals  ?Enc Vitals Group  ?   BP 05/14/21 0827 (!) 147/82  ?   Pulse Rate 05/14/21 0827 72  ?   Resp 05/14/21 0827 18  ?   Temp 05/14/21 0827 98.3 ?F (36.8 ?C)  ?   Temp Source 05/14/21 0827 Oral  ?   SpO2 05/14/21 0827 98 %  ?   Weight --   ?   Height --   ?   Head Circumference --   ?   Peak Flow --   ?   Pain Score 05/14/21 0826 5  ?   Pain Loc --   ?   Pain Edu? --   ?   Excl. in GC? --   ?No data found. ? ?Updated Vital Signs ?BP 111/71 (BP Location: Left Arm)   Pulse 80   Temp 98.2 ?F (36.8 ?C) (Oral)   Resp 20   SpO2 96%  ? ?Physical Exam ?Constitutional:   ?   General: He is not in acute distress. ?   Appearance: He is well-developed. He is ill-appearing. He is not toxic-appearing.  ?HENT:  ?   Head: Normocephalic and atraumatic.  ?  Salivary Glands: Right salivary gland is diffusely enlarged and tender. Left salivary gland is diffusely enlarged and tender.  ?   Right Ear: Hearing and external ear normal.  ?   Left Ear: Hearing and external ear normal.  ?   Ears:  ?   Comments: Bilateral EACs with mild erythema, bilateral TMs are normal ?   Nose: No mucosal edema, congestion or rhinorrhea.  ?   Right Turbinates: Not enlarged, swollen or pale.  ?   Left Turbinates: Not enlarged or swollen.  ?   Right Sinus: No maxillary sinus tenderness or frontal sinus tenderness.  ?   Left Sinus: No maxillary sinus tenderness or frontal sinus tenderness.  ?   Mouth/Throat:  ?   Lips: Pink. No lesions.  ?   Mouth: Mucous membranes are moist. No oral lesions or angioedema.  ?   Dentition: No gingival swelling.  ?   Tongue: No lesions.  ?   Palate: No mass.  ?   Pharynx: Uvula midline. Pharyngeal swelling, oropharyngeal exudate and posterior oropharyngeal erythema present. No uvula  swelling.  ?   Tonsils: Tonsillar exudate present. 2+ on the right. 2+ on the left.  ?Eyes:  ?   Extraocular Movements: Extraocular movements intact.  ?   Conjunctiva/sclera: Conjunctivae normal.  ?   Pupils: Pupils are equal, round, and reactive to light.  ?Neck:  ?   Thyroid: No thyroid mass, thyromegaly or thyroid tenderness.  ?   Trachea: Tracheal tenderness present. No abnormal tracheal secretions or tracheal deviation.  ?   Comments: Voice is muffled ?Cardiovascular:  ?   Rate and Rhythm: Normal rate and regular rhythm.  ?   Pulses: Normal pulses.  ?   Heart sounds: Normal heart sounds, S1 normal and S2 normal. No murmur heard. ?  No friction rub. No gallop.  ?Pulmonary:  ?   Effort: Pulmonary effort is normal. No accessory muscle usage, prolonged expiration, respiratory distress or retractions.  ?   Breath sounds: No stridor, decreased air movement or transmitted upper airway sounds. No decreased breath sounds, wheezing, rhonchi or rales.  ?Abdominal:  ?   General: Bowel sounds are normal.  ?   Palpations: Abdomen is soft.  ?   Tenderness: There is generalized abdominal tenderness. There is no right CVA tenderness, left CVA tenderness or rebound. Negative signs include Murphy's sign.  ?   Hernia: No hernia is present.  ?Musculoskeletal:     ?   General: No tenderness. Normal range of motion.  ?   Cervical back: Full passive range of motion without pain, normal range of motion and neck supple.  ?   Right lower leg: No edema.  ?   Left lower leg: No edema.  ?Lymphadenopathy:  ?   Cervical: Cervical adenopathy present.  ?   Right cervical: Superficial cervical adenopathy present.  ?   Left cervical: Superficial cervical adenopathy present.  ?Skin: ?   General: Skin is warm and dry.  ?   Findings: No erythema, lesion or rash.  ?Neurological:  ?   General: No focal deficit present.  ?   Mental Status: He is alert and oriented to person, place, and time. Mental status is at baseline.  ?Psychiatric:     ?   Mood and  Affect: Mood normal.     ?   Behavior: Behavior normal.     ?   Thought Content: Thought content normal.     ?   Judgment: Judgment normal.  ? ? ?  Visual Acuity ?Right Eye Distance:   ?Left Eye Distance:   ?Bilateral Distance:   ? ?Right Eye Near:   ?Left Eye Near:    ?Bilateral Near:    ? ?[facility] Couse / Diagnostics / Procedures:  ?  ?EKG ? ?Radiology ?No results found. ? ?Procedures ?Procedures (including critical care time) ? ?UC Diagnoses / Final Clinical Impressions(s)   ?I have reviewed the triage vital signs and the nursing notes. ? ?Pertinent labs & imaging results that were available during my care of the patient were reviewed by me and considered in my medical decision making (see chart for details).   ?Final diagnoses:  ?Exposure to Streptococcal pharyngitis  ? ?Rapid strep test is negative.  Given patient's exposure to strep and conjunctivitis, patient provided with prescriptions for cefdinir and for Cipro eyedrops.  Throat culture is pending.  Return precautions advised. ? ?ED Prescriptions   ?None ?  ? ?PDMP not reviewed this encounter. ? ?Pending results:  ?Labs Reviewed  ?CULTURE, GROUP A STREP [facility])  ?POCT RAPID STREP A (OFFICE)  ? ? ?Medications Ordered in UC: ?Medications - No data to display ? ?Disposition Upon Discharge:  ?Condition: stable for discharge home ?Home: take medications as prescribed; routine discharge instructions as discussed; follow up as advised. ? ?Patient presented with an acute illness with associated systemic symptoms and significant discomfort requiring urgent management. In my opinion, this is a condition that a prudent lay person (someone who possesses an average knowledge of health and medicine) may potentially expect to result in complications if not addressed urgently such as respiratory distress, impairment of bodily function or dysfunction of bodily organs.  ? ?Routine symptom specific, illness specific and/or disease specific instructions were discussed with the patient  and/or caregiver at length.  ? ?As such, the patient has been evaluated and assessed, work-up was performed and treatment was provided in alignment with urgent care protocols and evidence based medicine.  Patient/parent/

## 2021-10-28 NOTE — Discharge Instructions (Addendum)
Your rapid strep test today is negative.  Because of her known exposure to your child who has streptococcal pharyngitis, I have provided you with a prescription for antibiotics that would like for you to begin to take now while we await the results of your throat culture.  Once you have been on antibiotics for a full 24 hours, you are no longer considered contagious.   Please discard your toothbrush and any other oral devices that you are currently using and replace them with new ones today.    ? ?If you receive a phone call telling you that your throat culture is negative, I still strongly recommend that you complete your entire prescription of antibiotics regardless of the result of your throat culture, particularly if you begin to feel better within the first 48 hours of therapy.  The throat culture we perform here at urgent care only tests for group A strep and not any of the other bacteria that can also cause bacterial pharyngitis.   ?  ?Please begin ciprofloxacin eyedrops exactly as prescribed for possible bacterial conjunctivitis given your known exposure to your infected child. ?  ?Please follow-up within the next 7-10 days either with your primary care provider or urgent care if your symptoms do not resolve.  If you do not have a primary care provider, we will assist you in finding one. ?  ?Thank you for visiting urgent care today.  We appreciate the opportunity to participate in your care. ? ?

## 2021-10-28 NOTE — ED Triage Notes (Signed)
Pt states his children are taking abx for conjunctivitis, croups and strep. He c/o headache, and sore throat  ?Started: today ?

## 2021-11-01 LAB — CULTURE, GROUP A STREP (THRC)

## 2022-10-04 ENCOUNTER — Encounter (HOSPITAL_COMMUNITY): Payer: Self-pay

## 2022-10-04 ENCOUNTER — Ambulatory Visit (HOSPITAL_COMMUNITY)
Admission: RE | Admit: 2022-10-04 | Discharge: 2022-10-04 | Disposition: A | Discharge status: Home / Self Care | Payer: PRIVATE HEALTH INSURANCE | Source: Ambulatory Visit | Attending: Family Medicine | Admitting: Family Medicine

## 2022-10-04 VITALS — BP 127/84 | HR 83 | Temp 98.5°F | Resp 17

## 2022-10-04 DIAGNOSIS — M5416 Radiculopathy, lumbar region: Secondary | ICD-10-CM | POA: Diagnosis not present

## 2022-10-04 MED ORDER — CYCLOBENZAPRINE HCL 10 MG PO TABS: ORAL_TABLET | ORAL | 0 refills | Status: AC

## 2022-10-04 MED ORDER — HYDROCODONE-ACETAMINOPHEN 5-325 MG PO TABS: 1.0000 | ORAL_TABLET | Freq: Four times a day (QID) | ORAL | 0 refills | Status: AC | PRN

## 2022-10-04 NOTE — Discharge Instructions (Addendum)
Be aware, you have been prescribed pain medications that may cause drowsiness. While taking this medication, do not take any other medications containing acetaminophen (Tylenol). Do not combine with alcohol or recreational drugs. Please do not drive, operate heavy machinery, or take part in activities that require making important decisions while on this medication as your judgement may be clouded.  

## 2022-10-04 NOTE — ED Triage Notes (Signed)
Pt reports hx sciatic pain. Reports was changing son's diaper and when he moved to grab him with got pains in back which happened about 3 weeks ago. Was able to shift self off couch. Wife was able to help get off floor. Reports when got up started having black outs and wanted to go lay down. Did phone doctor/tele visit. Was called in steroid. Pt reports lost feeling in left leg and wasn't able to walk until after several doses of steroids. Now using cane to help with gait assistance. Called tele doctor back again.  Called to set up PCP appt but was advised to go to UC.  Still has really bad pains when lays down.  Hasn't had any imaging yet.  Concerned for slipped/ruptured disc.

## 2022-10-04 NOTE — ED Provider Notes (Signed)
[facility]   [id number] 10/04/22 Arrival Time: [place] PLAN:  1. Acute radicular low back pain    Able to ambulate here and hemodynamically stable. No trauma and normal neurological exam. Discussed.  Meds ordered this encounter  Medications   HYDROcodone-acetaminophen (NORCO/VICODIN) 5-325 MG tablet    Sig: Take 1 tablet by mouth every 6 (six) hours as needed for moderate pain or severe pain.    Dispense:  10 tablet    Refill:  0   cyclobenzaprine (FLEXERIL) 10 MG tablet    Sig: Take 1 tablet by mouth before bed as needed for muscle spasm. Warning: May cause drowsiness.    Dispense:  10 tablet    Refill:  0   Recommend:  Follow-up Information     Schedule an appointment as soon as possible for a visit  with [facility].   Specialty: Specialist Contact information: [address] [place] [address] [phone]                Medication sedation precautions given. Encourage ROM/movement as tolerated.  Recommend:  Follow-up Information     Schedule an appointment as soon as possible for a visit  with [facility].   Specialty: Specialist Contact information: [address] [place] [address] [phone]                [place] Controlled Substances Registry consulted for this patient. I feel the risk/benefit ratio today is favorable for proceeding with this prescription for a controlled substance. Medication sedation precautions given.   Reviewed expectations re: course of current medical issues. Questions answered. Outlined signs and symptoms indicating need for more acute intervention. Patient verbalized understanding. After Visit Summary given.   SUBJECTIVE: History from: patient.  Jonathan Valdez is a 32 y.o. male who presents with complaint of intermittent left sided lower back discomfort. Onset abrupt. First noted  3 weeks ago . Injury/trama: denied. History of back problems requiring medical  care: occasional. Pain described as aching and with radiation down posterior left leg . Aggravating factors: certain movements. Alleviating factors: have not been identified. Progressive LE weakness or saddle anesthesia: none. Extremity sensation changes or weakness: none. Ambulatory with cane for assistance. Normal bowel/bladder habits: yes; without urinary retention. Normal PO intake without n/v. No associated abdominal pain/n/v. Was Rx prednisone x 2 before being seen today; did help.   OBJECTIVE:  Vitals:   10/04/22 1146  BP: 127/84  Pulse: 83  Resp: 17  Temp: 98.5 F (36.9 C)  TempSrc: Oral  SpO2: 98%    General appearance: alert; no distress HEENT: [employer]; AT Neck: supple with FROM; without midline tenderness CV: regular Lungs: unlabored respirations; speaks full sentences without difficulty Abdomen: soft, non-tender; non-distended Back: mild  and poorly localized tenderness to palpation over LEFT lumbar paraspinal musculature ; FROM at waist; bruising: none; without midline tenderness Extremities: without edema; symmetrical without gross deformities; normal ROM of LLE Skin: warm and dry Neurologic: normal gait; normal sensation and strength of bilateral LE Psychological: alert and cooperative; normal mood and affect   No Known Allergies  Past Medical History:  Diagnosis Date   ADHD    Social History   Socioeconomic History   Marital status: Married    Spouse name: Not on file   Number of children: Not on file   Years of education: Not on file   Highest education level: Not on file  Occupational History   Not on file  Tobacco  Use   Smoking status: Former   Smokeless tobacco: Never   Tobacco comments:    Pt states he smokes less than one cig a week  Vaping Use   Vaping Use: Every day   Devices: Delta8  Substance and Sexual Activity   Alcohol use: Yes    Comment: occasionally    Drug use: No    Comment: Delta 8 - varient of [place] - legal limit- occasionally    Sexual activity: Not on file  Other Topics Concern   Not on file  Social History Narrative   Not on file   Social Determinants of Health   Financial Resource Strain: Not on file  Food Insecurity: Not on file  Transportation Needs: Not on file  Physical Activity: Not on file  Stress: Not on file  Social Connections: Not on file  Intimate Partner Violence: Not on file   Family History  Problem Relation Age of Onset   Breast cancer Maternal Grandmother    History reviewed. No pertinent surgical history.    Vanessa Kick, MD 10/04/22 [phone]

## 2022-10-24 ENCOUNTER — Other Ambulatory Visit: Payer: Self-pay | Admitting: Student in an Organized Health Care Education/Training Program

## 2022-10-24 DIAGNOSIS — M5416 Radiculopathy, lumbar region: Secondary | ICD-10-CM

## 2022-11-03 ENCOUNTER — Encounter: Payer: Self-pay | Admitting: Student in an Organized Health Care Education/Training Program

## 2022-11-11 ENCOUNTER — Other Ambulatory Visit: Payer: PRIVATE HEALTH INSURANCE

## 2022-11-26 ENCOUNTER — Inpatient Hospital Stay: Admission: RE | Admit: 2022-11-26 | Payer: PRIVATE HEALTH INSURANCE | Source: Ambulatory Visit

## 2024-01-02 ENCOUNTER — Ambulatory Visit: Payer: PRIVATE HEALTH INSURANCE | Admitting: Infectious Diseases

## 2024-01-03 ENCOUNTER — Encounter: Payer: Self-pay | Admitting: Infectious Diseases

## 2024-01-03 ENCOUNTER — Ambulatory Visit: Payer: PRIVATE HEALTH INSURANCE | Admitting: Infectious Diseases

## 2024-01-03 VITALS — BP 132/79 | HR 96 | Temp 98.5°F | Wt 315.2 lb

## 2024-01-03 DIAGNOSIS — Z227 Latent tuberculosis: Secondary | ICD-10-CM | POA: Diagnosis not present

## 2024-01-03 MED ORDER — VITAMIN B-6 100 MG PO TABS: 100.0000 mg | ORAL_TABLET | Freq: Every day | ORAL | 3 refills | Status: AC

## 2024-01-03 MED ORDER — ISONIAZID 300 MG PO TABS: 300.0000 mg | ORAL_TABLET | Freq: Every day | ORAL | 0 refills | Status: AC

## 2024-01-03 MED ORDER — RIFAMPIN 300 MG PO CAPS: 300.0000 mg | ORAL_CAPSULE | Freq: Every day | ORAL | 0 refills | Status: AC

## 2024-01-03 NOTE — Assessment & Plan Note (Signed)
 Will start him on Rifampin/Isonaizid LFTs normal 3 months ago.  HIV test, hepatitis b.  Explained risk of hepatitis, rash with rx.  Will see him back prn and in 3 months.

## 2024-01-03 NOTE — Progress Notes (Signed)
   Subjective:    Patient ID: Stan Eans, male  DOB: 1990-07-21, 33 y.o.        MRN: [id number]   HPI 33 yo M with hx of psoriasis. 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  Is going to be put on skyrizi and needs to be cleared.  Never lived overseas, never been in [employer], no incarceration, no hx of TB exposures.  No fevers, chills, no night sweats, no unintentional change in wt, no sob/cough. No LAN.    Health Maintenance  Topic Date Due  . HIV Screening  Never done  . Hepatitis C Screening  Never done  . DTaP/Tdap/Td (1 - Tdap) Never done  . HPV VACCINES (1 - 3-dose SCDM series) Never done  . COVID-19 Vaccine (4 - 2024-25 season) 03/19/2023  . INFLUENZA VACCINE  02/16/2024  . Meningococcal B Vaccine  Aged Out   Please see HPI. All other systems reviewed and negative.   Review of Systems  All other systems reviewed and are negative.   Please see HPI. All other systems reviewed and negative.     Objective:  Physical Exam Vitals reviewed.  Constitutional:      Appearance: Normal appearance. He is obese.  HENT:     Mouth/Throat:     Mouth: Mucous membranes are dry.   Eyes:     Extraocular Movements: Extraocular movements intact.     Pupils: Pupils are equal, round, and reactive to light.    Cardiovascular:     Rate and Rhythm: Normal rate and regular rhythm.  Pulmonary:     Effort: Pulmonary effort is normal.     Breath sounds: Normal breath sounds.  Abdominal:     General: Abdomen is flat. There is no distension.     Tenderness: There is no abdominal tenderness.   Musculoskeletal:     Right lower leg: No edema.     Left lower leg: No edema.   Neurological:     General: No focal deficit present.     Mental Status: He is alert.   Psychiatric:        Mood and Affect: Mood normal.          Assessment & Plan:

## 2024-01-04 LAB — HIV ANTIBODY (ROUTINE TESTING W REFLEX): HIV Screen 4th Generation wRfx: NONREACTIVE

## 2024-01-04 LAB — HEPATITIS B SURFACE ANTIBODY,QUALITATIVE: Hep B Surface Ab, Qual: NONREACTIVE

## 2024-04-12 ENCOUNTER — Other Ambulatory Visit: Payer: Self-pay | Admitting: Infectious Diseases

## 2024-04-12 DIAGNOSIS — Z227 Latent tuberculosis: Secondary | ICD-10-CM

## 2024-04-12 NOTE — Telephone Encounter (Signed)
 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  Should follow up with Dr Eben to see if needs further therapy.  Sent patient MyChart message to make an appointment with Dr Eben
